# Patient Record
Sex: Male | Born: 1945 | Race: White | Hispanic: No | Marital: Married | Smoking: Never smoker
Health system: Southern US, Community
[De-identification: ages and names within clinical notes are randomized; demographics above are authoritative.]

## PROBLEM LIST (undated history)

## (undated) DIAGNOSIS — I10 Essential (primary) hypertension: Secondary | ICD-10-CM

## (undated) DIAGNOSIS — K589 Irritable bowel syndrome without diarrhea: Secondary | ICD-10-CM

## (undated) DIAGNOSIS — K219 Gastro-esophageal reflux disease without esophagitis: Secondary | ICD-10-CM

## (undated) DIAGNOSIS — K9189 Other postprocedural complications and disorders of digestive system: Secondary | ICD-10-CM

## (undated) DIAGNOSIS — M1990 Unspecified osteoarthritis, unspecified site: Secondary | ICD-10-CM

## (undated) DIAGNOSIS — Z9889 Other specified postprocedural states: Secondary | ICD-10-CM

## (undated) DIAGNOSIS — K567 Ileus, unspecified: Secondary | ICD-10-CM

## (undated) DIAGNOSIS — R112 Nausea with vomiting, unspecified: Secondary | ICD-10-CM

## (undated) DIAGNOSIS — K449 Diaphragmatic hernia without obstruction or gangrene: Secondary | ICD-10-CM

## (undated) HISTORY — DX: Essential (primary) hypertension: I10

## (undated) HISTORY — PX: BACK SURGERY: SHX140

## (undated) HISTORY — DX: Gastro-esophageal reflux disease without esophagitis: K21.9

## (undated) HISTORY — PX: SHOULDER OPEN ROTATOR CUFF REPAIR: SHX2407

## (undated) HISTORY — DX: Diaphragmatic hernia without obstruction or gangrene: K44.9

## (undated) HISTORY — DX: Irritable bowel syndrome, unspecified: K58.9

---
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
## 1978-11-06 HISTORY — PX: EYE SURGERY: SHX253

## 1988-11-05 HISTORY — PX: ACHILLES TENDON REPAIR: SUR1153

## 1998-11-06 HISTORY — PX: ELBOW SURGERY: SHX618

## 1998-12-15 ENCOUNTER — Encounter: Payer: Self-pay | Admitting: Gastroenterology

## 1998-12-15 ENCOUNTER — Ambulatory Visit (HOSPITAL_COMMUNITY): Admission: RE | Admit: 1998-12-15 | Discharge: 1998-12-15 | Payer: Self-pay | Admitting: Gastroenterology

## 1999-10-27 ENCOUNTER — Encounter: Admission: RE | Admit: 1999-10-27 | Discharge: 1999-10-27 | Payer: Self-pay

## 2000-03-07 HISTORY — PX: ROTATOR CUFF REPAIR: SHX139

## 2000-07-21 ENCOUNTER — Encounter: Payer: Self-pay | Admitting: *Deleted

## 2000-07-21 ENCOUNTER — Emergency Department (HOSPITAL_COMMUNITY): Admission: EM | Admit: 2000-07-21 | Discharge: 2000-07-21 | Payer: Self-pay | Admitting: *Deleted

## 2000-07-22 ENCOUNTER — Inpatient Hospital Stay (HOSPITAL_COMMUNITY): Admission: EM | Admit: 2000-07-22 | Discharge: 2000-07-24 | Payer: Self-pay

## 2000-08-23 ENCOUNTER — Ambulatory Visit (HOSPITAL_COMMUNITY): Admission: RE | Admit: 2000-08-23 | Discharge: 2000-08-23 | Payer: Self-pay | Admitting: Gastroenterology

## 2002-11-05 ENCOUNTER — Encounter: Admission: RE | Admit: 2002-11-05 | Discharge: 2002-11-05 | Payer: Self-pay | Admitting: Internal Medicine

## 2002-11-05 ENCOUNTER — Encounter: Payer: Self-pay | Admitting: Internal Medicine

## 2005-01-14 ENCOUNTER — Ambulatory Visit: Payer: Self-pay | Admitting: Hematology & Oncology

## 2005-07-01 ENCOUNTER — Ambulatory Visit: Payer: Self-pay | Admitting: Internal Medicine

## 2009-03-07 HISTORY — PX: SHOULDER ARTHROSCOPY W/ ROTATOR CUFF REPAIR: SHX2400

## 2010-03-28 ENCOUNTER — Encounter: Payer: Self-pay | Admitting: Orthopaedic Surgery

## 2010-10-01 ENCOUNTER — Encounter (INDEPENDENT_AMBULATORY_CARE_PROVIDER_SITE_OTHER): Payer: Self-pay | Admitting: Surgery

## 2010-10-01 ENCOUNTER — Ambulatory Visit (INDEPENDENT_AMBULATORY_CARE_PROVIDER_SITE_OTHER): Payer: PRIVATE HEALTH INSURANCE | Admitting: Surgery

## 2010-10-01 VITALS — BP 146/94 | HR 64 | Temp 97.6°F | Ht 71.0 in | Wt 175.0 lb

## 2010-10-01 DIAGNOSIS — K219 Gastro-esophageal reflux disease without esophagitis: Secondary | ICD-10-CM

## 2010-10-01 NOTE — Progress Notes (Signed)
Addended by: Latricia Heft on: 10/01/2010 02:59 PM   Modules accepted: Orders

## 2010-10-01 NOTE — Progress Notes (Signed)
Subjective:     Patient ID: Ronnie Hardy, male   DOB: June 19, 1945, 65 y.o.   MRN: [id number]  HPIMr. Ronnie Hardy comes in today to discuss gastroesophageal reflux and Nissen fundoplication. He was referred by Dr. Merri Brunette has been followed over the years by Dr. Sheryn Bison. He has been complaining of reading about Nissen fundoplication seems well versed in the procedure risks and complications. Over years he has had problems with adult-onset asthma. He describes bending over and refluxing and refluxing occasionally at night. He doesn't have pain if he takes a PPI. He is concerned about his long-term use of the PPIs and its effect on his B12 levels. He has had some foot numbness.  We discussed the procedure in detail. He wants to go and get this scheduled.  Her medications include all site over-the-counter and Lunesta 2 mg daily. His prior surgeries include Achilles tendon repair 25 years ago, left shoulder rotator cuff repair 10 years ago right shoulder rotator cuff repair in 2011 and a left and right elbow and repairs back in 2000 and 2010.  15 point review of systems is normal except for some rash easy infusion bruisability. He however states he's not a free bleeder. He has some irritable bowel disease and GERD as previously mentioned. Family history is mother and father both deceased with heart disease his mother had cancer. One living brother. He does not smoke has never smoked. He drinks occasionally.  Physical exam reveals a well developed well maintained younger than his stated age white male 5 feet 11 weight 175. Blood pressure 146/94 pulse 64 afebrile. Head normocephalic eyes sclerae nonicteric pupils equal round reactive to light nose and throat exam unremarkable neck higher no bruits. Chest is clear to auscultation heart sinus rhythm without murmurs or gallops abdomen nontender no masses or organomegaly. Extremity exam elbow surgery as mentioned above. No cyanosis edema clubbing. No  history of DVT. Neuro alert and oriented x3. Motor incision function grossly intact. Slight patient is appropriate affect and good fund of knowledge.  Impression significant history of gastroesophageal reflux disease probably with hiatal hernia. Plan to get upper GI will go and scheduled for a laparoscopic Nissen fundoplication. Aware that these can also be done open.   Review of Systems   Objective:   Physical Exam     Assessment:         Plan:    Lap Nissen

## 2010-10-12 ENCOUNTER — Ambulatory Visit (HOSPITAL_COMMUNITY): Admission: RE | Admit: 2010-10-12 | Payer: PRIVATE HEALTH INSURANCE | Source: Ambulatory Visit

## 2010-10-19 ENCOUNTER — Inpatient Hospital Stay (HOSPITAL_COMMUNITY): Admission: RE | Admit: 2010-10-19 | Payer: PRIVATE HEALTH INSURANCE | Source: Ambulatory Visit | Admitting: Surgery

## 2011-01-07 ENCOUNTER — Other Ambulatory Visit: Payer: Self-pay | Admitting: Internal Medicine

## 2011-01-07 DIAGNOSIS — M545 Low back pain, unspecified: Secondary | ICD-10-CM

## 2011-01-17 ENCOUNTER — Ambulatory Visit
Admission: RE | Admit: 2011-01-17 | Discharge: 2011-01-17 | Disposition: A | Discharge status: Home / Self Care | Payer: PRIVATE HEALTH INSURANCE | Source: Ambulatory Visit | Attending: Internal Medicine | Admitting: Internal Medicine

## 2011-01-17 DIAGNOSIS — M545 Low back pain, unspecified: Secondary | ICD-10-CM

## 2011-11-09 ENCOUNTER — Other Ambulatory Visit: Payer: Self-pay | Admitting: Neurosurgery

## 2011-11-09 DIAGNOSIS — M545 Low back pain, unspecified: Secondary | ICD-10-CM

## 2011-11-11 ENCOUNTER — Other Ambulatory Visit: Payer: PRIVATE HEALTH INSURANCE

## 2011-11-17 ENCOUNTER — Ambulatory Visit
Admission: RE | Admit: 2011-11-17 | Discharge: 2011-11-17 | Disposition: A | Discharge status: Home / Self Care | Payer: PRIVATE HEALTH INSURANCE | Source: Ambulatory Visit | Attending: Neurosurgery | Admitting: Neurosurgery

## 2011-11-17 DIAGNOSIS — M545 Low back pain, unspecified: Secondary | ICD-10-CM

## 2012-01-06 ENCOUNTER — Ambulatory Visit (INDEPENDENT_AMBULATORY_CARE_PROVIDER_SITE_OTHER): Payer: PRIVATE HEALTH INSURANCE | Admitting: Sports Medicine

## 2012-01-06 ENCOUNTER — Encounter: Payer: Self-pay | Admitting: Sports Medicine

## 2012-01-06 VITALS — BP 130/81 | HR 70 | Ht 71.0 in | Wt 170.0 lb

## 2012-01-06 DIAGNOSIS — M7661 Achilles tendinitis, right leg: Secondary | ICD-10-CM | POA: Insufficient documentation

## 2012-01-06 DIAGNOSIS — M7660 Achilles tendinitis, unspecified leg: Secondary | ICD-10-CM

## 2012-01-06 MED ORDER — NITROGLYCERIN 0.2 MG/HR TD PT24: MEDICATED_PATCH | TRANSDERMAL | Status: DC

## 2012-01-06 NOTE — Assessment & Plan Note (Signed)
Patient with an Achilles tendon nodule with evidence of tendinosis.  Hypoechoic change thickness calcification and increased Doppler activity present.  The pop the patient felt may have been  a plantaris tendon rupture. Plan: Eccentric calf exercises  Nitroglycerin protocol  Followup 6 weeks

## 2012-01-06 NOTE — Patient Instructions (Signed)
Thank you for coming in today. You have achilles tendonitis. Do the calf eccentric exercises.  15-20 reps 2 sets x2 daily  Come back in 6 weeks or sooner if needed.  Nitroglycerin Protocol   Apply 1/4 nitroglycerin patch to affected area daily.  Change position of patch within the affected area every 24 hours.  You may experience a headache during the first 1-2 weeks of using the patch, these should subside.  If you experience headaches after beginning nitroglycerin patch treatment, you may take your preferred over the counter pain reliever.  Another side effect of the nitroglycerin patch is skin irritation or rash related to patch adhesive.  Please notify our office if you develop more severe headaches or rash, and stop the patch.  Tendon healing with nitroglycerin patch may require 12 to 24 weeks depending on the extent of injury.  Men should not use if taking Viagra, Cialis, or Levitra.   Do not use if you have migraines or rosacea.

## 2012-01-06 NOTE — Progress Notes (Signed)
Ronnie Hardy is a 66 y.o. male who presents to [facility] today for right Achilles tendon pain.  Present for about a month.  Patient was stretching his Achilles tendon after exercise when he felt a pop in his proximal calf.  He was told he may have a gastrocnemius tear.  He never developed any significant ecchymosis or swelling.  He notes continued pain in the distal Achilles tendon a few centimeters proximal to the insertion of the calcaneus.   His pain is worse with Achilles tendon dominant exercise such as cycling on his toes up a hill.  He denies any functional impairment.  He feels well otherwise without any radiating pain weakness or numbness.  He has a pertinent medical history for a left Achilles tendon nodule that was debrieded 20-30 years ago.    PMH reviewed.  Significant for hypertension no history of headaches History  Substance Use Topics  . Smoking status: Never Smoker   . Smokeless tobacco: Never Used  . Alcohol Use: Yes     occasional   ROS as above otherwise neg   Exam:  BP 130/81  Pulse 70  Ht 5\' 11"  (1.803 m)  Wt 170 lb (77.111 kg)  BMI 23.71 kg/m2 Gen: Well NAD MSK:  Right lower leg.  No significant calf swelling or ecchymosis.  Mildly swollen nodule over the distal Achilles tendon. Mildly tender over the distal Achilles tendon nodule. Normal foot motion strength. Normal sensation capillary refill and pulses distally.   Musculoskeletal ultrasound of the right Achilles tendon. Normal-appearing tendon at the insertion of the calcaneus measures 0.46 cm  Significant nodule seen approximately 2-3 cm proximal maximal thickness is 1.3 cm. Small fleck of calcification and a longitudinal split with increased Doppler activity seen within the nodule.  Proximal Achilles tendon is normal appearing.  Proximal calf is normal with no significant tear of the gastrocnemius or soleus.

## 2012-01-09 ENCOUNTER — Encounter: Payer: Self-pay | Admitting: Cardiovascular Disease

## 2012-01-09 ENCOUNTER — Ambulatory Visit (INDEPENDENT_AMBULATORY_CARE_PROVIDER_SITE_OTHER): Payer: PRIVATE HEALTH INSURANCE | Admitting: Cardiovascular Disease

## 2012-01-09 VITALS — BP 125/80 | HR 73 | Ht 71.0 in | Wt 177.0 lb

## 2012-01-09 DIAGNOSIS — I10 Essential (primary) hypertension: Secondary | ICD-10-CM

## 2012-01-09 MED ORDER — LOSARTAN POTASSIUM 100 MG PO TABS: 100.0000 mg | ORAL_TABLET | Freq: Every day | ORAL | Status: DC

## 2012-01-09 MED ORDER — HYDROCHLOROTHIAZIDE 25 MG PO TABS: 25.0000 mg | ORAL_TABLET | Freq: Every day | ORAL | Status: DC

## 2012-01-09 NOTE — Patient Instructions (Addendum)
Your physician recommends that you return for lab work in: today  Your physician has recommended you make the following change in your medication:   Start losartan 100mg  daily Start HCTZ 25 mg every morning  Your physician recommends that you schedule a follow-up appointment in: 3 months

## 2012-01-09 NOTE — Progress Notes (Signed)
Ronnie Hardy Date of Birth  24-Jun-1945       [facility] Office [address]  [address] [place], [place]  [address]   [place], [place]  [address] [phone]     [phone]   Fax  [phone]    Fax [phone]  Problem List: 1. Hypertension 2. Asthma   History of Present Illness:  Ronnie Hardy is a 66 yo with hx of HTN.  Avoids salty or fatty foods.  Works out daily .    He has had HTN for the past 5 years or so.   He has a family hx of heart disease ( mother died of stroke, father died of MI - smoker)   He has throbbing in his throat and then knows that his BP is elevated.    He has not been diagnosed with sleep apnea but admits that he doesn't sleep well.    Current Outpatient Prescriptions on File Prior to Visit  Medication Sig Dispense Refill  . amLODipine (NORVASC) 10 MG tablet Take 10 mg by mouth daily.      . Fluticasone-Salmeterol (ADVAIR DISKUS IN) Inhale into the lungs as needed.      Marland Kitchen losartan (COZAAR) 25 MG tablet Take 25 mg by mouth daily.      . nitroGLYCERIN (NITRODUR - DOSED IN MG/24 HR) 0.2 mg/hr 1/4 patch to achilles tendon daily for pain  30 patch  1  . Omeprazole (PRILOSEC PO) Take by mouth daily. Taking the OTC. Might be 10 mg, patient is not sure.       . Eszopiclone (ESZOPICLONE) 3 MG TABS Take 3 mg by mouth at bedtime. Take immediately before bedtime         Allergies  Allergen Reactions  . Anesthetics, Amide Nausea Only    Surgical anesthetic    Past Medical History  Diagnosis Date  . Hypertension   . IBS (irritable bowel syndrome)   . Hiatal hernia   . GERD (gastroesophageal reflux disease)   . Asthma     Past Surgical History  Procedure Date  . Rotator cuff repair 2002    left  . Achilles tendon repair   . Right shoulder surgery 2011  . Elbow surgery     both    History  Smoking status  . Never Smoker   Smokeless tobacco  . Never Used    History  Alcohol Use  .  Yes    Comment: occasional    Family History  Problem Relation Age of Onset  . Heart disease Mother   . Cancer Mother   . Heart disease Father     Reviw of Systems:  Reviewed in the HPI.  All other systems are negative.  Physical Exam: Blood pressure 130/80, pulse 73, height 5\' 11"  (1.803 m), weight 177 lb (80.287 kg), SpO2 98.00%. General: Well developed, well nourished, in no acute distress.  Head: Normocephalic, atraumatic, sclera non-icteric, mucus membranes are moist,   Neck: Supple. Carotids are 2 + without bruits. No JVD  Lungs: slight wheezing in the left base  Heart: regular rate.  normal  S1 S2. No murmurs, gallops or rubs.  Abdomen: Soft, non-tender, non-distended with normal bowel sounds. No hepatomegaly. No rebound/guarding. No masses.  Msk:  Strength and tone are normal  Extremities: No clubbing or cyanosis. No edema.  Distal pedal pulses are 2+ and equal bilaterally.  Neuro: Alert and oriented X 3. Moves all extremities  spontaneously.  Psych:  Responds to questions appropriately with a normal affect.  ECG: NSR  Assessment / Plan:

## 2012-01-09 NOTE — Assessment & Plan Note (Signed)
Ronnie Hardy presents for discussion of his HTN. His BP is frequently normal but is high on occasion.  He exercises regularly and watches his diet.  He is on a good medical regimen and his BP is normal here today.  I suspect his HTN is being exacerbated by his lack of restfull sleep.  I have suggested that he see a councilor for sleep hygeine - has has done many of the usual things to get to sleep.  He will try Yoga and other relaxation techniques.   We discussed trying Bystolic instead of AMlodipine.  I will see him back in several months.

## 2012-01-10 LAB — BASIC METABOLIC PANEL
Calcium: 8.8 mg/dL (ref 8.4–10.5)
GFR: 87.38 mL/min (ref >60.00)
Glucose, Bld: 87 mg/dL (ref 70–99)
Sodium: 135 mEq/L (ref 135–145)

## 2012-02-15 ENCOUNTER — Ambulatory Visit: Payer: PRIVATE HEALTH INSURANCE | Admitting: Family Medicine

## 2012-04-10 ENCOUNTER — Ambulatory Visit (INDEPENDENT_AMBULATORY_CARE_PROVIDER_SITE_OTHER): Payer: PRIVATE HEALTH INSURANCE | Admitting: Cardiovascular Disease

## 2012-04-10 ENCOUNTER — Encounter: Payer: Self-pay | Admitting: Cardiovascular Disease

## 2012-04-10 VITALS — BP 130/82 | HR 67 | Ht 71.0 in | Wt 178.0 lb

## 2012-04-10 DIAGNOSIS — I10 Essential (primary) hypertension: Secondary | ICD-10-CM

## 2012-04-10 NOTE — Patient Instructions (Addendum)
Call wellness nurse-  Orbie Hurst [phone]  Your physician recommends that you return for a FASTING lipid profile: 6 MONTHS  Your physician wants you to follow-up in: 6 MONTHS WITH EKG You will receive a reminder letter in the mail two months in advance. If you don't receive a letter, please call our office to schedule the follow-up appointment.  Your physician recommends that you continue on your current medications as directed. Please refer to the Current Medication list given to you today.

## 2012-04-10 NOTE — Assessment & Plan Note (Signed)
Ronnie Hardy is doing well from a cardiac standpoint. His blood pressure readings have been at the upper limits of normal. He still is not sleeping well. I've given him the name of a nurse practitioner  Wellsite geologist) who has a Chartered loss adjuster.  She may be of help him with relaxation techniques.  We'll continue with his same medications. I seen again in 6 months. We'll check fasting lipid profile, basic profile, and EKG at that time.

## 2012-04-10 NOTE — Progress Notes (Signed)
Ronnie Hardy Date of Birth  08-21-1945       [facility] Office [address]  [address] [place], [place]  [address]   [place], [place]  [address] [phone]     [phone]   Fax  [phone]    Fax [phone]  Problem List: 1. Hypertension 2. Asthma   History of Present Illness:  Ronnie Hardy is a 67 yo with hx of HTN.  Avoids salty or fatty foods.  Works out daily .    He has had HTN for the past 5 years or so.   He has a family hx of heart disease ( mother died of stroke, father died of MI - smoker)   He has throbbing in his throat and then knows that his BP is elevated.   He has not been diagnosed with sleep apnea but admits that he doesn't sleep well.    Feb. 4, 2014: BP has been 130s / 80s.  He was started on Horizant but it seems to be causing some peripheral edema.   Current Outpatient Prescriptions on File Prior to Visit  Medication Sig Dispense Refill  . cyclobenzaprine (FLEXERIL) 10 MG tablet Take 10 mg by mouth daily.      . Eszopiclone (ESZOPICLONE) 3 MG TABS Take 3 mg by mouth at bedtime. Take immediately before bedtime       . Fluticasone-Salmeterol (ADVAIR DISKUS IN) Inhale into the lungs as needed.      . hydrochlorothiazide (HYDRODIURIL) 25 MG tablet Take 1 tablet (25 mg total) by mouth daily.  90 tablet  1  . losartan (COZAAR) 100 MG tablet Take 1 tablet (100 mg total) by mouth daily.  90 tablet  1  . nitroGLYCERIN (NITRODUR - DOSED IN MG/24 HR) 0.2 mg/hr 1/4 patch to achilles tendon daily for pain  30 patch  1  . Omeprazole (PRILOSEC PO) Take by mouth daily. Taking the OTC. Might be 10 mg, patient is not sure.         Allergies  Allergen Reactions  . Anesthetics, Amide Nausea Only    Surgical anesthetic    Past Medical History  Diagnosis Date  . Hypertension   . IBS (irritable bowel syndrome)   . Hiatal hernia   . GERD (gastroesophageal reflux disease)   . Asthma     Past Surgical  History  Procedure Date  . Rotator cuff repair 2002    left  . Achilles tendon repair   . Right shoulder surgery 2011  . Elbow surgery     both    History  Smoking status  . Never Smoker   Smokeless tobacco  . Never Used    History  Alcohol Use  . Yes    Comment: occasional    Family History  Problem Relation Age of Onset  . Heart disease Mother   . Cancer Mother   . Heart disease Father     Reviw of Systems:  Reviewed in the HPI.  All other systems are negative.  Physical Exam: Blood pressure 130/82, pulse 67, height 5\' 11"  (1.803 m), weight 178 lb (80.74 kg). General: Well developed, well nourished, in no acute distress.  Head: Normocephalic, atraumatic, sclera non-icteric, mucus membranes are moist,   Neck: Supple. Carotids are 2 + without bruits. No JVD  Lungs: slight wheezing in the left base  Heart: regular rate.  normal  S1 S2. No murmurs, gallops or rubs.  Abdomen:  Soft, non-tender, non-distended with normal bowel sounds. No hepatomegaly. No rebound/guarding. No masses.  Msk:  Strength and tone are normal  Extremities: No clubbing or cyanosis. No edema.  Distal pedal pulses are 2+ and equal bilaterally.  Neuro: Alert and oriented X 3. Moves all extremities spontaneously.  Psych:  Responds to questions appropriately with a normal affect.  ECG: Every 4, 2014-normal sinus rhythm at 67. He has no ST or T wave changes.  Assessment / Plan:

## 2012-06-18 ENCOUNTER — Ambulatory Visit (INDEPENDENT_AMBULATORY_CARE_PROVIDER_SITE_OTHER): Payer: PRIVATE HEALTH INSURANCE | Admitting: Sports Medicine

## 2012-06-18 ENCOUNTER — Encounter: Payer: Self-pay | Admitting: Sports Medicine

## 2012-06-18 VITALS — BP 124/74 | Ht 71.0 in | Wt 170.0 lb

## 2012-06-18 DIAGNOSIS — M48061 Spinal stenosis, lumbar region without neurogenic claudication: Secondary | ICD-10-CM | POA: Insufficient documentation

## 2012-06-18 DIAGNOSIS — M7660 Achilles tendinitis, unspecified leg: Secondary | ICD-10-CM

## 2012-06-18 DIAGNOSIS — M7661 Achilles tendinitis, right leg: Secondary | ICD-10-CM

## 2012-06-18 DIAGNOSIS — G4700 Insomnia, unspecified: Secondary | ICD-10-CM

## 2012-06-18 MED ORDER — NITROGLYCERIN 0.2 MG/HR TD PT24: MEDICATED_PATCH | TRANSDERMAL | Status: DC

## 2012-06-18 NOTE — Assessment & Plan Note (Signed)
I think his RLS sxs may relate more to the spinal stenosis inspite of findings on sleep studies He seems to have specific triggers that make this worse -- including sitting and driving More sxs if standing too long or fatigue  I agree with Dr Carolee Rota approach to use gabapentin However, patient has never gotten above about 300 mgms 2/2 side effects  Try to wean Lunesta and build dose of gabapentin Stop flexeril  If this strategy not working consider other medical options

## 2012-06-18 NOTE — Assessment & Plan Note (Signed)
He is clearly improved I think he could benefit from continuing eccentric rehab and NTG protocol as he still has a lot of remodeling to do on both RT which was the recent injury and LT which has had surgery  Rescan this in 2 to 3 mos

## 2012-06-18 NOTE — Patient Instructions (Addendum)
Horizant is a type of gabapentin  Dose is now too low to have maximal effect  Try stop flexeril  Try weaning Lunesta - by cutting in half and after 1 week leave out tab every 3 day then every other day for 1 week then stop  Immediately up the Horizant to a full tab - take earlier in evening if makes you sleepy  Let me know at 3 weeks are you seeing benefit  If not we want to see if we can boost you to therapeutic dose  If not tolerated we need to move to different product  Keep easy back motion/ yoga is good/  Work easy flexion exercises - knee to chest  Keep up aerobic exercise as tolerated but work in some walking

## 2012-06-18 NOTE — Progress Notes (Signed)
Patient ID: Ronnie Hardy, male   DOB: [birth year], 67 y.o.   MRN: [id number]  Hx of possible RLS However has documented lumbar spinal stenosis He is on medications as listed but also uses a brand Gabapentin medication 300 mgm qhs (1/2 tab) He has chronic insomnia Dr Renne Crigler has sent him for 3? Sleep studies which showed a lot of RLS activity but the patient states that he never came close to getting into any good sleep pattern during these studies  He has known lumbar spinal stenosis Dr Wyline Mood NS at [facility] has operated between MRIs in 2012 and 2013 This helped some of the radiating sxs However, Dr Wyline Mood does not feel like a fusion would not really resolve this and that at this point medical management is best option as he has several levels of DDD as well as foraminal and facet joint changes that cause nerve compression  He never built the dose of gabapentin since he gets so sedated using this in combo with lunesta and flexeril Lunesta now wears off within 4 hours and flexeril helps him sleep through night  No longer able to run Bikes regularly and that does not worsen sxs Sitting worsens sxs if longer than 2 hrs/ 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  Now no TTP Lt AT is somewhat thick and irregular but non-tender  LB exam Flexion normal without pain Extension limited to 15 to 20 deg Lat bend very limited  Rotation tight but iproves with stretching  Heel/ Toe walk normal Heel raise shows good strength and post tib function  MRI  Reviewed with patient and findings of lumbar stenosis, facet DJD and DDD demonstrated and discussed Left AT shows surgical

## 2012-06-18 NOTE — Assessment & Plan Note (Signed)
Seems that he is getting limited benefit from his lunesta and flexeril at this point  Hoping that he will get more benefit from higher dose gabapentin  Will follow up after 4 to 6 weeks of medication changes and see if we can find better options

## 2012-06-19 ENCOUNTER — Ambulatory Visit: Payer: PRIVATE HEALTH INSURANCE | Admitting: Sports Medicine

## 2012-07-10 ENCOUNTER — Other Ambulatory Visit: Payer: Self-pay | Admitting: *Deleted

## 2012-07-10 DIAGNOSIS — I10 Essential (primary) hypertension: Secondary | ICD-10-CM

## 2012-07-10 MED ORDER — HYDROCHLOROTHIAZIDE 25 MG PO TABS: 25.0000 mg | ORAL_TABLET | Freq: Every day | ORAL | Status: DC

## 2012-07-10 MED ORDER — LOSARTAN POTASSIUM 100 MG PO TABS: 100.0000 mg | ORAL_TABLET | Freq: Every day | ORAL | Status: DC

## 2012-07-10 NOTE — Telephone Encounter (Signed)
Fax Received. Refill Completed. Yanissa Michalsky Chowoe (R.M.A)   

## 2012-07-10 NOTE — Telephone Encounter (Signed)
Fax Received. Refill Completed. Jeily Guthridge Chowoe (R.M.A)   

## 2013-09-20 ENCOUNTER — Other Ambulatory Visit: Payer: Self-pay | Admitting: Physician Assistant

## 2013-09-20 ENCOUNTER — Other Ambulatory Visit: Payer: Self-pay

## 2013-09-20 DIAGNOSIS — M5136 Other intervertebral disc degeneration, lumbar region: Secondary | ICD-10-CM

## 2013-09-28 ENCOUNTER — Other Ambulatory Visit: Payer: PRIVATE HEALTH INSURANCE

## 2014-04-20 HISTORY — PX: POSTERIOR FUSION LUMBAR SPINE, MINIMALLY INVASIVE: SUR633

## 2014-05-22 ENCOUNTER — Encounter (HOSPITAL_COMMUNITY): Payer: Self-pay | Admitting: General Practice

## 2014-05-22 ENCOUNTER — Observation Stay (HOSPITAL_COMMUNITY)
Admission: AD | Admit: 2014-05-22 | Discharge: 2014-05-23 | Discharge status: Against Medical Advice | Payer: PRIVATE HEALTH INSURANCE | Source: Ambulatory Visit | Attending: Gastroenterology | Admitting: Gastroenterology

## 2014-05-22 ENCOUNTER — Ambulatory Visit
Admission: RE | Admit: 2014-05-22 | Discharge: 2014-05-22 | Disposition: A | Discharge status: Home / Self Care | Payer: PRIVATE HEALTH INSURANCE | Source: Ambulatory Visit | Attending: Gastroenterology | Admitting: Gastroenterology

## 2014-05-22 ENCOUNTER — Other Ambulatory Visit: Payer: Self-pay | Admitting: Gastroenterology

## 2014-05-22 DIAGNOSIS — I10 Essential (primary) hypertension: Secondary | ICD-10-CM | POA: Insufficient documentation

## 2014-05-22 DIAGNOSIS — K589 Irritable bowel syndrome without diarrhea: Secondary | ICD-10-CM | POA: Diagnosis not present

## 2014-05-22 DIAGNOSIS — K913 Postprocedural intestinal obstruction: Principal | ICD-10-CM | POA: Insufficient documentation

## 2014-05-22 DIAGNOSIS — K219 Gastro-esophageal reflux disease without esophagitis: Secondary | ICD-10-CM | POA: Diagnosis not present

## 2014-05-22 DIAGNOSIS — Y929 Unspecified place or not applicable: Secondary | ICD-10-CM | POA: Insufficient documentation

## 2014-05-22 DIAGNOSIS — R1084 Generalized abdominal pain: Secondary | ICD-10-CM

## 2014-05-22 DIAGNOSIS — J45909 Unspecified asthma, uncomplicated: Secondary | ICD-10-CM | POA: Insufficient documentation

## 2014-05-22 DIAGNOSIS — F419 Anxiety disorder, unspecified: Secondary | ICD-10-CM | POA: Diagnosis not present

## 2014-05-22 DIAGNOSIS — K9189 Other postprocedural complications and disorders of digestive system: Secondary | ICD-10-CM

## 2014-05-22 DIAGNOSIS — E871 Hypo-osmolality and hyponatremia: Secondary | ICD-10-CM | POA: Diagnosis not present

## 2014-05-22 DIAGNOSIS — M549 Dorsalgia, unspecified: Secondary | ICD-10-CM | POA: Insufficient documentation

## 2014-05-22 DIAGNOSIS — Y838 Other surgical procedures as the cause of abnormal reaction of the patient, or of later complication, without mention of misadventure at the time of the procedure: Secondary | ICD-10-CM | POA: Diagnosis not present

## 2014-05-22 DIAGNOSIS — K567 Ileus, unspecified: Secondary | ICD-10-CM

## 2014-05-22 HISTORY — DX: Unspecified osteoarthritis, unspecified site: M19.90

## 2014-05-22 HISTORY — DX: Other postprocedural complications and disorders of digestive system: K91.89

## 2014-05-22 HISTORY — DX: Other specified postprocedural states: R11.2

## 2014-05-22 HISTORY — DX: Ileus, unspecified: K56.7

## 2014-05-22 HISTORY — DX: Nausea with vomiting, unspecified: Z98.890

## 2014-05-22 LAB — COMPREHENSIVE METABOLIC PANEL
ALT: 21 U/L (ref 0–53)
AST: 49 U/L — ABNORMAL HIGH (ref 0–37)
Albumin: 3.3 g/dL — ABNORMAL LOW (ref 3.5–5.2)
Alkaline Phosphatase: 61 U/L (ref 39–117)
Anion gap: 7 (ref 5–15)
BILIRUBIN TOTAL: 1.1 mg/dL (ref 0.3–1.2)
BUN: 11 mg/dL (ref 6–23)
CO2: 30 mmol/L (ref 19–32)
CREATININE: 0.72 mg/dL (ref 0.50–1.35)
Calcium: 8.7 mg/dL (ref 8.4–10.5)
Chloride: 91 mmol/L — ABNORMAL LOW (ref 96–112)
Glucose, Bld: 116 mg/dL — ABNORMAL HIGH (ref 70–99)
POTASSIUM: 3.9 mmol/L (ref 3.5–5.1)
Sodium: 128 mmol/L — ABNORMAL LOW (ref 135–145)
TOTAL PROTEIN: 6.3 g/dL (ref 6.0–8.3)

## 2014-05-22 LAB — CBC
HCT: 34.2 % — ABNORMAL LOW (ref 39.0–52.0)
HEMOGLOBIN: 12.1 g/dL — AB (ref 13.0–17.0)
MCH: 31.3 pg (ref 26.0–34.0)
MCHC: 35.4 g/dL (ref 30.0–36.0)
MCV: 88.4 fL (ref 78.0–100.0)
Platelets: 153 10*3/uL (ref 150–400)
RBC: 3.87 MIL/uL — AB (ref 4.22–5.81)
RDW: 12.2 % (ref 11.5–15.5)
WBC: 6.9 10*3/uL (ref 4.0–10.5)

## 2014-05-22 MED ORDER — OXYCODONE HCL 5 MG PO TABS
5.0000 mg | ORAL_TABLET | Freq: Four times a day (QID) | ORAL | Status: DC | PRN
Start: 2014-05-22 — End: 2014-05-23

## 2014-05-22 MED ORDER — FLEET ENEMA 7-19 GM/118ML RE ENEM
2.0000 | ENEMA | Freq: Once | RECTAL | Status: AC
  Administered 2014-05-22: 2 via RECTAL
  Filled 2014-05-22: qty 2

## 2014-05-22 MED ORDER — FAMOTIDINE 40 MG PO TABS
40.0000 mg | ORAL_TABLET | Freq: Two times a day (BID) | ORAL | Status: DC
  Administered 2014-05-23: 40 mg via ORAL
  Filled 2014-05-22 (×3): qty 1

## 2014-05-22 MED ORDER — SODIUM CHLORIDE 0.9 % IV BOLUS (SEPSIS): 1000.0000 mL | Freq: Once | INTRAVENOUS | Status: DC

## 2014-05-22 MED ORDER — SODIUM CHLORIDE 0.9 % IV SOLN: INTRAVENOUS | Status: DC

## 2014-05-22 NOTE — H&P (Addendum)
Ronnie Hardy HPI: This is a 69 year old male with a PMH of GERD, HTN, asthma, and lower back surgeries admitted for a post-operative ileus.  He underwent lower back surgery this past Monday and when he woke up from the surgery his abdomen was bloated.  He was treated with two enemas, one small and one large, without any benefit.  [facility] he had persistent bloating and he presented to Dr. Loreta Hardy today for further evaluation.  A KUB was obtained it was consistent with an ileus.  Over the interval time period he has not had any significant passage of flatus.  He was prescribed oxycodone 10 mg q4 hours, but he weaned himself down to 5 mg per day.  In the hospital he was on 10 mg.  His pain has been intermittent.  Currently he feels well and he is uncertain if he needs to be hospitalized.  Past Medical History  Diagnosis Date  . Hypertension   . IBS (irritable bowel syndrome)   . Hiatal hernia   . GERD (gastroesophageal reflux disease)   . Asthma     Past Surgical History  Procedure Laterality Date  . Rotator cuff repair  2002    left  . Achilles tendon repair    . Right shoulder surgery  2011  . Elbow surgery      both    Family History  Problem Relation Age of Onset  . Heart disease Mother   . Cancer Mother   . Heart disease Father     Social History:  reports that he has never smoked. He has never used smokeless tobacco. He reports that he drinks alcohol. He reports that he does not use illicit drugs.  Allergies:  Allergies  Allergen Reactions  . No Known Allergies   . Anesthetics, Amide Nausea Only    Surgical anesthetic    Medications:  Scheduled:  Continuous: . sodium chloride      No results found for this or any previous visit (from the past 24 hour(s)).   Dg Abd 2 Views  05/22/2014   CLINICAL DATA:  Abdominal pain. Status post lumbar surgery 1 week prior  EXAM: ABDOMEN - 2 VIEW  COMPARISON:  None.  FINDINGS: Supine and upright  images were obtained. There is extensive stool throughout the right colon. There is generalized bowel dilatation without appreciable air-fluid level. No free air. Air is present in the rectum. There is postoperative change in the lumbar region.  IMPRESSION: The bowel gas pattern is felt to be most consistent with a degree of ileus. There is extensive stool throughout the right colon. No free air. Postoperative change lower lumbar spine.   Electronically Signed   By: Bretta BangWilliam  Woodruff Hardy Ronnie Hardy.   On: 05/22/2014 15:12    ROS:  As stated above in the HPI otherwise negative.  Blood pressure 148/88, pulse 73, temperature 98 F (36.7 C), temperature source Oral, resp. rate 16, SpO2 98 %.    PE: Gen: NAD, Alert and Oriented HEENT:  Ronnie Hardy/AT, EOMI Neck: Supple, no LAD Lungs: CTA Bilaterally CV: RRR without M/G/R ABM: Soft, mildly distended, tympanic, +BS Ext: No C/C/E  Assessment/Plan: 1) Post-operative ileus. 2) GERD. 3) HTN.   He has some soft bowel sounds.  NSAIDs, per his report from the Surgeon, are contraindicated for pain control.  He will try to minimized his oxycodone intake.  The KUB demonstrates a mild ileus and significant stool in the right side of the  colon.  Since he is mobile I encouraged him to ambulate.  In the past lying in a prone position helped to alleviate his bloating, but this time it has not helped.  I did encouraged him to continue with this positioning.  An NG tube was offered, but he reports that he cannot tolerate the tube.  In the past a manometry was pursued, but he was not able to go through the procedure.  Plan: 1) NPO except ice chips. 2) IV hydration. 3) Check basic blood work. 4) Encouraged ambulation and prone position. 5) H2RA for GERD. 6) He already took his antihypertensives today and possibly he will be discharged tomorrow.  Ronnie Hardy 05/22/2014, 6:11 PM

## 2014-05-23 DIAGNOSIS — K913 Postprocedural intestinal obstruction: Secondary | ICD-10-CM | POA: Diagnosis not present

## 2014-05-23 LAB — BASIC METABOLIC PANEL
Anion gap: 9 (ref 5–15)
BUN: 8 mg/dL (ref 6–23)
CHLORIDE: 95 mmol/L — AB (ref 96–112)
CO2: 29 mmol/L (ref 19–32)
Calcium: 9.4 mg/dL (ref 8.4–10.5)
Creatinine, Ser: 0.7 mg/dL (ref 0.50–1.35)
GFR calc Af Amer: >90 mL/min (ref >90)
GFR calc non Af Amer: >90 mL/min (ref >90)
GLUCOSE: 99 mg/dL (ref 70–99)
POTASSIUM: 3.4 mmol/L — AB (ref 3.5–5.1)
SODIUM: 133 mmol/L — AB (ref 135–145)

## 2014-05-23 MED ORDER — POLYETHYLENE GLYCOL 3350 17 G PO PACK
17.0000 g | PACK | Freq: Once | ORAL | Status: AC
  Administered 2014-05-23: 17 g via ORAL
  Filled 2014-05-23: qty 1

## 2014-05-23 MED ORDER — LORAZEPAM 2 MG/ML IJ SOLN
2.0000 mg | Freq: Once | INTRAMUSCULAR | Status: AC
  Administered 2014-05-23: 2 mg via INTRAVENOUS
  Filled 2014-05-23: qty 1

## 2014-05-23 NOTE — Progress Notes (Signed)
UR completed 

## 2014-05-23 NOTE — Discharge Summary (Signed)
Physician Discharge Summary  Patient ID: Ronnie Hardy MRN: [id number] DOB/AGE: [birth year] 69 y.o.  Admit date: 05/22/2014 Discharge date: 05/23/2014  Admission Diagnoses: Post-operative ileus.                                         Back pain from back surgery.  Discharge Diagnoses: Post-operative ileus.                                         Back pain from back surgery. Active Problems:   Ileus, postoperative   Discharged Condition: stable  Hospital Course: Ronnie Hardy patient was admitted last evening and he had a very difficult night.  He reports that the bed was only aggravating to his back.  He called Dr. Loreta AveMann over the night and relayed to her his complaints.  Dr. Loreta AveMann assisted by giving him some enemas, holding his IV fluids, and providing him with a dose of Movantik.  Unfortunately he did not respond to these interventions.  The patient has a history of anxiety and he was administered Ativan to see if this can help this AM.  His blood work revealed that he has a hyponatremia of 128, but I do not know the values from [facility].  It is uncertain if this is from a hypovolemic hyponatremia versus hypervolemic hypernatremia.  Additionally, he is taking HCTZ, which compounds the issue.  I had a long discussion with the patient about leaving the hospital and he understands that leaving is against medical advice.  His mental status is intact, but he is agitated this AM secondary to his abdominal discomfort and his back pain.  The patient has not taken any pain medications and this has allowed him to have flatus, but no bowel movements yet.  He feels that he can obtain better care at home with his more comfortable bed and heating pad.  ADDENDUM: His AM sodium is 133.  Consults: None  Significant Diagnostic Studies: labs: BMP and CBC  Treatments: Movantik, enemas, Ativan  Discharge Exam: Blood pressure 166/84, pulse 65, temperature 98.2 F (36.8 C), temperature source Oral, resp. rate 18,  height 5\' 11"  (1.803 m), weight 78.472 kg (173 lb), SpO2 100 %. General appearance: Alert, oriented, uncomfortable GI: tympanic, soft, nontender  Disposition: Home with follow up in the office on Monday for a BMP.     Medication List    ASK your doctor about these medications        acetaminophen 325 MG tablet  Commonly known as:  TYLENOL  Take 975 mg by mouth every 8 (eight) hours.     ADVAIR DISKUS IN  Inhale into the lungs as needed.     fluticasone-salmeterol 230-21 MCG/ACT inhaler  Commonly known as:  ADVAIR HFA  Inhale into the lungs.     cyclobenzaprine 10 MG tablet  Commonly known as:  FLEXERIL  Take 10 mg by mouth daily.     eszopiclone 3 MG Tabs  Generic drug:  Eszopiclone  Take 3 mg by mouth at bedtime. Take immediately before bedtime     famotidine 10 MG tablet  Commonly known as:  PEPCID  Take 10 mg by mouth at bedtime as needed. For heartburn     folic acid 1 MG tablet  Commonly known as:  FOLVITE  Take 1  mg by mouth daily.     glucosamine-chondroitin 500-400 MG tablet  Take by mouth 3 (three) times daily.     HORIZANT 600 MG Tbcr  Generic drug:  Gabapentin Enacarbil  Take by mouth.     hydrochlorothiazide 12.5 MG capsule  Commonly known as:  MICROZIDE  Take 12.5 mg by mouth daily.     hydrochlorothiazide 25 MG tablet  Commonly known as:  HYDRODIURIL  Take 1 tablet (25 mg total) by mouth daily.     losartan 100 MG tablet  Commonly known as:  COZAAR  Take 1 tablet (100 mg total) by mouth daily.     meloxicam 7.5 MG tablet  Commonly known as:  MOBIC  Take 7.5 mg by mouth 3 (three) times daily with meals.     MULTI-VITAMINS Tabs  Take 1 tablet by mouth daily.     nitroGLYCERIN 0.2 mg/hr patch  Commonly known as:  NITRODUR - Dosed in mg/24 hr  1/4 patch to achilles tendon daily for pain     Omega-3 1000 MG Caps  Take by mouth daily.     oxyCODONE 5 MG immediate release tablet  Commonly known as:  Oxy IR/ROXICODONE  Take 5 mg by mouth  every 4 (four) hours as needed.     PRILOSEC PO  Take by mouth daily. Taking the OTC. Might be 10 mg, patient is not sure.     ranitidine 150 MG tablet  Commonly known as:  ZANTAC  Take 150 mg by mouth 2 (two) times daily.     senna-docusate 8.6-50 MG per tablet  Commonly known as:  Senokot-S  Take by mouth 2 (two) times daily.     tetrahydrozoline 0.05 % ophthalmic solution  Apply 1 drop to eye as needed.     tiZANidine 2 MG tablet  Commonly known as:  ZANAFLEX  Take 2 mg by mouth 3 (three) times daily as needed. Muscle spasms     Vitamin-B Complex Tabs  Take by mouth.         SignedJeani Hawking D 05/23/2014, 8:33 AM

## 2014-05-23 NOTE — Progress Notes (Signed)
Patient discharge teaching given, including activity, diet, follow-up appoints, and medications. Patient verbalized understanding of all discharge instructions. IV access was d/c'd. Vitals are stable. Skin is intact except as charted in most recent assessments. Pt to be escorted out by NT, to be driven home by family. 

## 2015-01-07 ENCOUNTER — Other Ambulatory Visit: Payer: Self-pay | Admitting: Otolaryngology

## 2015-01-09 ENCOUNTER — Other Ambulatory Visit: Payer: Self-pay | Admitting: Otolaryngology

## 2015-01-09 DIAGNOSIS — H905 Unspecified sensorineural hearing loss: Secondary | ICD-10-CM

## 2015-01-23 ENCOUNTER — Ambulatory Visit
Admission: RE | Admit: 2015-01-23 | Discharge: 2015-01-23 | Disposition: A | Discharge status: Home / Self Care | Payer: PRIVATE HEALTH INSURANCE | Source: Ambulatory Visit | Attending: Otolaryngology | Admitting: Otolaryngology

## 2015-01-23 DIAGNOSIS — H905 Unspecified sensorineural hearing loss: Secondary | ICD-10-CM

## 2015-01-23 MED ORDER — GADOBENATE DIMEGLUMINE 529 MG/ML IV SOLN
15.0000 mL | Freq: Once | INTRAVENOUS | Status: AC | PRN
  Administered 2015-01-23: 15 mL via INTRAVENOUS

## 2015-03-06 ENCOUNTER — Other Ambulatory Visit: Payer: Self-pay | Admitting: Neurosurgery

## 2015-03-06 DIAGNOSIS — Z981 Arthrodesis status: Secondary | ICD-10-CM

## 2015-03-06 DIAGNOSIS — M48061 Spinal stenosis, lumbar region without neurogenic claudication: Secondary | ICD-10-CM

## 2015-03-06 DIAGNOSIS — M5416 Radiculopathy, lumbar region: Secondary | ICD-10-CM

## 2015-03-07 ENCOUNTER — Other Ambulatory Visit: Payer: PRIVATE HEALTH INSURANCE

## 2015-03-15 ENCOUNTER — Ambulatory Visit
Admission: RE | Admit: 2015-03-15 | Discharge: 2015-03-15 | Disposition: A | Discharge status: Home / Self Care | Payer: PRIVATE HEALTH INSURANCE | Source: Ambulatory Visit | Attending: Neurosurgery | Admitting: Neurosurgery

## 2015-03-15 DIAGNOSIS — M5416 Radiculopathy, lumbar region: Secondary | ICD-10-CM

## 2015-03-15 DIAGNOSIS — Z981 Arthrodesis status: Secondary | ICD-10-CM

## 2015-03-15 DIAGNOSIS — M48061 Spinal stenosis, lumbar region without neurogenic claudication: Secondary | ICD-10-CM

## 2015-07-23 ENCOUNTER — Other Ambulatory Visit: Payer: Self-pay

## 2016-06-28 ENCOUNTER — Other Ambulatory Visit: Payer: Self-pay | Admitting: Internal Medicine

## 2016-06-28 DIAGNOSIS — I700 Atherosclerosis of aorta: Secondary | ICD-10-CM

## 2016-07-01 ENCOUNTER — Ambulatory Visit
Admission: RE | Admit: 2016-07-01 | Discharge: 2016-07-01 | Disposition: A | Discharge status: Home / Self Care | Payer: No Typology Code available for payment source | Source: Ambulatory Visit | Attending: Internal Medicine | Admitting: Internal Medicine

## 2016-07-01 DIAGNOSIS — I700 Atherosclerosis of aorta: Secondary | ICD-10-CM

## 2016-07-12 ENCOUNTER — Ambulatory Visit (INDEPENDENT_AMBULATORY_CARE_PROVIDER_SITE_OTHER): Payer: PRIVATE HEALTH INSURANCE | Admitting: Cardiovascular Disease

## 2016-07-12 ENCOUNTER — Telehealth (HOSPITAL_COMMUNITY): Payer: Self-pay | Admitting: *Deleted

## 2016-07-12 ENCOUNTER — Encounter: Payer: Self-pay | Admitting: Cardiovascular Disease

## 2016-07-12 VITALS — BP 110/74 | HR 58 | Ht 71.0 in | Wt 170.8 lb

## 2016-07-12 DIAGNOSIS — R0789 Other chest pain: Secondary | ICD-10-CM

## 2016-07-12 DIAGNOSIS — I2584 Coronary atherosclerosis due to calcified coronary lesion: Secondary | ICD-10-CM | POA: Diagnosis not present

## 2016-07-12 DIAGNOSIS — I2510 Atherosclerotic heart disease of native coronary artery without angina pectoris: Secondary | ICD-10-CM

## 2016-07-12 NOTE — Progress Notes (Signed)
Ronnie Hardy Date of Birth  [birth year]       [facility] Office [address]  [address] [place], [place]  [address]   [place], [place]  [address] [phone]     [phone]   Fax  [phone]    Fax [phone]  Problem List: 1. Hypertension 2. Asthma 3. Coronary artery calcifications   01/07/2013 Ronnie Hardy is a 71 yo with hx of HTN.  Avoids salty or fatty foods.  Works out daily .    He has had HTN for the past 5 years or so.   He has a family hx of heart disease ( mother died of stroke, father died of MI - smoker)   He has throbbing in his throat and then knows that his BP is elevated.   He has not been diagnosed with sleep apnea but admits that he doesn't sleep well.    Feb. 4, 2014: BP has been 130s / 80s.  He was started on Horizant but it seems to be causing some peripheral edema.   Ronnie Hardy is seen back for evaluation of coronary artery calcifications . In March he developed a bid chest cold.   Was still short of breath.   CXR showed some calcifications on the aorta ( and ? Coronary )    A coronary calcium score was 390 which is in the 68% of matched subjects   Was started on a statin and ASA by Dr. Renne Crigler.   Works out 5 times a week. No angina with work outs .   Has some  He goes mountain biking on occasion. Has some dyspnea recently which seems to resolve as he continues to cycle.  The tightness seems to be worse in the afternoon and evening.    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  Had a heart cath by Dr. Lorenda Hatchet 2003.  Cath was normal.     Current Outpatient Prescriptions on File Prior to Visit  Medication Sig Dispense Refill  . B Complex Vitamins (VITAMIN-B COMPLEX) TABS Take by mouth.    . Eszopiclone (ESZOPICLONE) 3 MG TABS Take 3 mg by mouth at bedtime. Take immediately before bedtime     . famotidine (PEPCID) 10 MG tablet Take 10 mg by mouth at bedtime as needed. For heartburn     . Fluticasone-Salmeterol (ADVAIR DISKUS IN) Inhale into the lungs as needed.    Marland Kitchen losartan (COZAAR) 100 MG tablet Take 1 tablet (100 mg total) by mouth daily. 90 tablet 3  . Multiple Vitamin (MULTI-VITAMINS) TABS Take 1 tablet by mouth daily.    . Omega-3 1000 MG CAPS Take by mouth daily.    . ranitidine (ZANTAC) 150 MG tablet Take 150 mg by mouth 2 (two) times daily.    Marland Kitchen tetrahydrozoline 0.05 % ophthalmic solution Apply 1 drop to eye as needed (allergies).      No current facility-administered medications on file prior to visit.     Allergies  Allergen Reactions  . Meperidine Nausea And Vomiting  . No Known Allergies   . Anesthetics, Amide Nausea Only    Surgical anesthetic    Past Medical History:  Diagnosis Date  . Arthritis    "all over"  . Asthma   . GERD (gastroesophageal reflux disease)   . Hiatal hernia   . Hypertension   . IBS (irritable bowel syndrome)   . PONV (postoperative nausea and vomiting)   .  Postoperative ileus (HCC) 05/22/2014   S/P back OR 05/19/2014    Past Surgical History:  Procedure Laterality Date  . ACHILLES TENDON REPAIR Left 1990's  . BACK SURGERY    . ELBOW SURGERY Bilateral 2000's   "scoped them and took out bone chips"  . EYE SURGERY Left 1980's   "removed splinters"  . POSTERIOR FUSION LUMBAR SPINE, MINIMALLY INVASIVE  04/20/2014  . ROTATOR CUFF REPAIR Left 2002  . SHOULDER ARTHROSCOPY W/ ROTATOR CUFF REPAIR Right 2011  . SHOULDER OPEN ROTATOR CUFF REPAIR Left 1990's; 2000's  . TONSILLECTOMY  1954    History  Smoking Status  . Never Smoker  Smokeless Tobacco  . Never Used    History  Alcohol Use  . Yes    Comment: 05/22/2014 "a drink q 2 wk or so"    Family History  Problem Relation Age of Onset  . Heart disease Mother   . Cancer Mother   . Heart disease Father     Reviw of Systems:  Reviewed in the HPI.  All other systems are negative.  Physical Exam: Blood pressure 110/74, pulse (!) 58, height 5\' 11"  (1.803 m),  weight 170 lb 12.8 oz (77.5 kg). General: Well developed, well nourished, in no acute distress.  Head: Normocephalic, atraumatic, sclera non-icteric, mucus membranes are moist,   Neck: Supple. Carotids are 2 + without bruits. No JVD  Lungs: slight wheezing in the left base  Heart: regular rate.  normal  S1 S2. No murmurs, gallops or rubs.  Abdomen: Soft, non-tender, non-distended with normal bowel sounds. No hepatomegaly. No rebound/guarding. No masses.  Msk:  Strength and tone are normal  Extremities: No clubbing or cyanosis. No edema.  Distal pedal pulses are 2+ and equal bilaterally.  Neuro: Alert and oriented X 3. Moves all extremities spontaneously.  Psych:  Responds to questions appropriately with a normal affect.  ECG: Jul 12, 2016:   Sinus brady with 1st degree AV block   Assessment / Plan:   1.  coronary calcifications: Ronnie KotykJay presents today with recent diagnosis of coronary calcifications primarily in the left anterior descending artery. His  Score is 390. He is having some episodes of chest tightness and shortness of breath with exertion. We'll schedule him for a stress Myoview study the next several days. I would have a low threshold to proceed with cardiac catheterization if he has any progression or acceleration in his symptoms.  He has been started on aspirin 81 mg a day and rosuvastatin 5 mg a day.  We'll ask Dr. Renne CriglerPharr to send in his most recent lipid levels. I anticipate increasing his rosuvastatin to 10 or perhaps 20 mg a day.  2. Hypertension: His blood pressure is normal. I'll see him in follow-up in 6 weeks-sooner if needed.    Ronnie MissPhilip Nahser, MD  07/12/2016 11:00 AM    [facility] [address],  [address] [place], [place]  [address] Pager [phone] Phone: [phone]; Fax: [phone]) [phone]

## 2016-07-12 NOTE — Telephone Encounter (Signed)
Left message on voicemail in reference to upcoming appointment scheduled for 07/14/16. Phone number given for a call back so details instructions can be given. Ronnie Hardy   

## 2016-07-12 NOTE — Patient Instructions (Signed)
Medication Instructions:  None  Labwork: None  Testing/Procedures: Your physician has requested that you have en exercise stress myoview. For further information please visit [email or web address]. Please follow instruction sheet, as given.   Follow-Up: Your physician recommends that you schedule a follow-up appointment in: 6 weeks with Dr. Elease HashimotoNahser.   Any Other Special Instructions Will Be Listed Below (If Applicable).     If you need a refill on your cardiac medications before your next appointment, please call your pharmacy.

## 2016-07-13 DIAGNOSIS — R0789 Other chest pain: Secondary | ICD-10-CM | POA: Insufficient documentation

## 2016-07-14 ENCOUNTER — Ambulatory Visit (HOSPITAL_COMMUNITY): Payer: 59 | Attending: Cardiology

## 2016-07-14 DIAGNOSIS — I2510 Atherosclerotic heart disease of native coronary artery without angina pectoris: Secondary | ICD-10-CM | POA: Diagnosis present

## 2016-07-14 DIAGNOSIS — R079 Chest pain, unspecified: Secondary | ICD-10-CM | POA: Insufficient documentation

## 2016-07-14 DIAGNOSIS — I10 Essential (primary) hypertension: Secondary | ICD-10-CM | POA: Diagnosis not present

## 2016-07-14 DIAGNOSIS — R0789 Other chest pain: Secondary | ICD-10-CM

## 2016-07-14 DIAGNOSIS — Z8249 Family history of ischemic heart disease and other diseases of the circulatory system: Secondary | ICD-10-CM | POA: Diagnosis not present

## 2016-07-14 DIAGNOSIS — R0609 Other forms of dyspnea: Secondary | ICD-10-CM | POA: Insufficient documentation

## 2016-07-14 LAB — MYOCARDIAL PERFUSION IMAGING
CHL CUP NUCLEAR SDS: 0
CHL CUP NUCLEAR SSS: 3
CHL CUP RESTING HR STRESS: 50 {beats}/min
CSEPEDS: 0 s
Estimated workload: 13.5 METS
Exercise duration (min): 12 min
LVDIAVOL: 130 mL (ref 62–150)
LVSYSVOL: 55 mL
MPHR: 150 {beats}/min
NUC STRESS TID: 0.98
Peak HR: 129 {beats}/min
Percent HR: 86 %
RATE: 0.27
SRS: 3

## 2016-07-14 MED ORDER — TECHNETIUM TC 99M TETROFOSMIN IV KIT
32.2000 | PACK | Freq: Once | INTRAVENOUS | Status: AC | PRN
  Administered 2016-07-14: 32.2 via INTRAVENOUS
  Filled 2016-07-14: qty 33

## 2016-07-14 MED ORDER — TECHNETIUM TC 99M TETROFOSMIN IV KIT
10.3000 | PACK | Freq: Once | INTRAVENOUS | Status: AC | PRN
  Administered 2016-07-14: 10.3 via INTRAVENOUS
  Filled 2016-07-14: qty 11

## 2016-07-18 ENCOUNTER — Telehealth: Payer: Self-pay | Admitting: Cardiovascular Disease

## 2016-07-18 NOTE — Telephone Encounter (Signed)
Discussed Ronnie Hardy's low risk myoview on the phone. He has coronary calcifications. Given his coronary calcifications, his goal LDL is < 70. Awaiting lab work from his primary MD  He has resumed working out and has no CP Will see me as scheduled in 6 weeks.    Kristeen MissPhilip Nahser, MD  07/18/2016 2:29 PM    [facility] [address],  [address] [place], [place]  [address] Pager [phone] Phone: [phone]; Fax: [phone]) [phone]

## 2016-07-21 ENCOUNTER — Other Ambulatory Visit (INDEPENDENT_AMBULATORY_CARE_PROVIDER_SITE_OTHER): Payer: PRIVATE HEALTH INSURANCE

## 2016-07-21 ENCOUNTER — Ambulatory Visit (INDEPENDENT_AMBULATORY_CARE_PROVIDER_SITE_OTHER): Payer: PRIVATE HEALTH INSURANCE | Admitting: Pulmonary Disease

## 2016-07-21 ENCOUNTER — Encounter: Payer: Self-pay | Admitting: Pulmonary Disease

## 2016-07-21 ENCOUNTER — Other Ambulatory Visit: Payer: Self-pay | Admitting: Pulmonary Disease

## 2016-07-21 DIAGNOSIS — J302 Other seasonal allergic rhinitis: Secondary | ICD-10-CM | POA: Diagnosis not present

## 2016-07-21 DIAGNOSIS — J45909 Unspecified asthma, uncomplicated: Secondary | ICD-10-CM | POA: Diagnosis not present

## 2016-07-21 DIAGNOSIS — K219 Gastro-esophageal reflux disease without esophagitis: Secondary | ICD-10-CM | POA: Diagnosis not present

## 2016-07-21 DIAGNOSIS — Z77090 Contact with and (suspected) exposure to asbestos: Secondary | ICD-10-CM

## 2016-07-21 DIAGNOSIS — M1990 Unspecified osteoarthritis, unspecified site: Secondary | ICD-10-CM | POA: Insufficient documentation

## 2016-07-21 LAB — CBC WITH DIFFERENTIAL/PLATELET
BASOS PCT: 1.2 % (ref 0.0–3.0)
Basophils Absolute: 0.1 10*3/uL (ref 0.0–0.1)
EOS PCT: 5.3 % — AB (ref 0.0–5.0)
Eosinophils Absolute: 0.3 10*3/uL (ref 0.0–0.7)
HCT: 41.9 % (ref 39.0–52.0)
Hemoglobin: 14.1 g/dL (ref 13.0–17.0)
LYMPHS ABS: 1.1 10*3/uL (ref 0.7–4.0)
Lymphocytes Relative: 17 % (ref 12.0–46.0)
MCHC: 33.6 g/dL (ref 30.0–36.0)
MCV: 91.6 fl (ref 78.0–100.0)
MONOS PCT: 11.5 % (ref 3.0–12.0)
Monocytes Absolute: 0.7 10*3/uL (ref 0.1–1.0)
Neutro Abs: 4.1 10*3/uL (ref 1.4–7.7)
Neutrophils Relative %: 65 % (ref 43.0–77.0)
Platelets: 202 10*3/uL (ref 150.0–400.0)
RBC: 4.57 Mil/uL (ref 4.22–5.81)
RDW: 13.3 % (ref 11.5–15.5)
WBC: 6.3 10*3/uL (ref 4.0–10.5)

## 2016-07-21 NOTE — Progress Notes (Signed)
Subjective:    Patient ID: Ronnie Hardy, male    DOB: Jan 29, 1946, 70 y.o.   MRN: [id number]  HPI The patient reports he was remotely diagnosed with asthma and followed with Dr. Maple Hudson. He reports he had a "bad chest cold" in March. He started using Advair and some of his chest discomfort resolved. However, his chest tightness persisted. He reports he was seen by his PCP and given a course of Prednisone without symptomatic relief. He then subsequently started using an albuterol rescue inhaler without relief. He reports he was found to have some calcification in his coronary arteries and underwent stress test without incident. He reports his symptoms improved with regards to his chest tightness last week. He reports no history of recurrent bronchitis or pneumonia. No breathing problems as a child. He reports he started having problems with his breathing in his 59s. No chest pain currently. He denies any sinus congestion or drainage currently. He has been using Flonase for symptom relief. Denies any wheezing currently. He reports daily reflux and is taking both Pepcid and Zantac. Previously elevated the head of his bed but his wife won't allow it. Does wake up with brash water taste. No fever, chills, or sweats. No rashes or abnormal bruising. He does occasional have abdominal bloating.   Review of Systems He reports his urine stream is slow. Occasional urinary urgency. No hematuria or dysuria. He reports chronic joint pain in his elbows, wrists, back. No joint swelling or erythema. A pertinent 14 point review of systems is negative except as per the history of presenting illness.  Allergies  Allergen Reactions  . Meperidine Nausea And Vomiting  . No Known Allergies   . Anesthetics, Amide Nausea Only    Surgical anesthetic    Current Outpatient Prescriptions on File Prior to Visit  Medication Sig Dispense Refill  . aspirin 81 MG tablet Take 81 mg by mouth daily.    . B Complex Vitamins  (VITAMIN-B COMPLEX) TABS Take by mouth.    . Eszopiclone (ESZOPICLONE) 3 MG TABS Take 3 mg by mouth at bedtime. Take immediately before bedtime     . famotidine (PEPCID) 10 MG tablet Take 10 mg by mouth at bedtime as needed. For heartburn    . Fluticasone-Salmeterol (ADVAIR DISKUS IN) Inhale into the lungs as needed.    Marland Kitchen losartan (COZAAR) 100 MG tablet Take 1 tablet (100 mg total) by mouth daily. 90 tablet 3  . MAGNESIUM PO Take 1 tablet by mouth daily.    . Multiple Vitamin (MULTI-VITAMINS) TABS Take 1 tablet by mouth daily.    . Omega-3 1000 MG CAPS Take by mouth daily.    . ranitidine (ZANTAC) 150 MG tablet Take 150 mg by mouth 2 (two) times daily.    . rosuvastatin (CRESTOR) 5 MG tablet Take 5 mg by mouth daily.  4  . tetrahydrozoline 0.05 % ophthalmic solution Apply 1 drop to eye as needed (allergies).      No current facility-administered medications on file prior to visit.     Past Medical History:  Diagnosis Date  . Arthritis    "all over"  . Asthma   . GERD (gastroesophageal reflux disease)   . Hiatal hernia   . Hypertension   . IBS (irritable bowel syndrome)   . PONV (postoperative nausea and vomiting)   . Postoperative ileus (HCC) 05/22/2014   S/P back OR 05/19/2014    Past Surgical History:  Procedure Laterality Date  . ACHILLES TENDON REPAIR Left  1990's  . BACK SURGERY    . ELBOW SURGERY Bilateral 2000's   "scoped them and took out bone chips"  . EYE SURGERY Left 1980's   "removed splinters"  . POSTERIOR FUSION LUMBAR SPINE, MINIMALLY INVASIVE  04/20/2014  . ROTATOR CUFF REPAIR Left 2002  . SHOULDER ARTHROSCOPY W/ ROTATOR CUFF REPAIR Right 2011  . SHOULDER OPEN ROTATOR CUFF REPAIR Left 1990's; 2000's  . TONSILLECTOMY  1954    Family History  Problem Relation Age of Onset  . Heart disease Mother   . Stroke Mother   . Brain cancer Mother   . Heart disease Father   . Alzheimer's disease Brother   . Asthma Brother   . Asthma Daughter     Social History    Social History  . Marital status: Married    Spouse name: N/A  . Number of children: N/A  . Years of education: N/A   Social History Main Topics  . Smoking status: Passive Smoke Exposure - Never Smoker    Types: Cigarettes  . Smokeless tobacco: Never Used     Comment: Father & Mother when he was a child.   . Alcohol use Yes     Comment: 05/22/2014 "a drink q 2 wk or so"  . Drug use: No  . Sexual activity: Not Currently   Other Topics Concern  . None   Social History Narrative   [facility] (07/21/16):   Originally from [facility]. He has mostly lived in [place]. Has traveled to [place] to visit relatives. 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 No bird exposure. Previously had mold in a [place] that he [place] previously. Rarely ever uses his hot tub. 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 Also enjoys gardening. Does have probable asbestos exposure.       Objective:   Physical Exam BP 126/64 (BP Location: Left Arm, Cuff Size: Normal)   Pulse 63   Ht 5\' 11"  (1.803 m)   Wt 170 lb 9.6 oz (77.4 kg)   SpO2 98%   BMI 23.79 kg/m  General:  Awake. Alert. No acute distress. Well-dressed male. Integument:  Warm & dry. No rash on exposed skin. No bruising on exposed skin. Extremities:  No cyanosis or clubbing.  Lymphatics:  No appreciated cervical or supraclavicular lymphadenoapthy. HEENT:  Moist mucus membranes. No oral ulcers. No scleral injection or icterus. Moderate bilateral nasal turbinate swelling with pale mucosa. Cardiovascular:  Regular rate. No edema. Regular rhythm.  Pulmonary:  Good aeration & clear to auscultation bilaterally. Symmetric chest wall expansion. No accessory muscle use on room air. Abdomen: Soft. Normal bowel sounds. Nondistended. Grossly nontender. Musculoskeletal:  Normal bulk and tone. Hand grip strength 5/5 bilaterally. No joint deformity or effusion appreciated. Mild synovial thickening of  bilateral PIP & DIP joints. Neurological:  CN 2-12 grossly in tact. No meningismus. Moving all 4 extremities equally. Symmetric brachioradialis deep tendon reflexes. Psychiatric:  Mood and affect congruent. Speech normal rhythm, rate & tone.   IMAGING CARDIAC CT 07/01/16 (personally reviewed by me): Limited lung windows reviewed. No pleural thickening or effusion appreciated. Patient did have a 5 mm at least partially solid and 4 mm groundglass nodule within the anterior segment of the right lower lobe. No other parenchymal opacity appreciated. Subcentimeter subcarinal lymphadenopathy. No pericardial effusion appreciated.    Assessment & Plan:  71 y.o. male with long-standing history of adult onset asthma. Patient also has symptoms  consistent with chronic allergic rhinitis. He does not have prior pulmonary function testing but given his absence of symptoms I doubt airway remodeling is present. We did discuss the pathophysiology of airway remodeling and the development of COPD with incompletely controlled asthma. We also discussed his cardiac CT which did show some subcentimeter nodules within his right lower lobe. We will need to perform CT imaging to follow up on the nodules but I suspect these are due to prior exposures. Interestingly, the patient does have a history of a brief exposure to asbestos. I instructed the patient contact my office if he had any recurrence of his symptoms or questions before his next appointment.  1. Asthma:  Checking full pulmonary function testing before next appointment. Holding off on inhaler medication at this time. Checking CBC with differential. 2. Chronic allergic rhinitis:  Checking serum RAST panel. Continuing Flonase. 3. GERD: Continuing Zantac daily at bedtime. Patient declines PPI therapy. Educated on dietary and lifestyle modification. 4. Multiple Lung Nodules on Chest CT: CT chest without contrast. 5. History of asbestos exposure: Brief period one-time event.  Monitoring with chest imaging and pulmonary function testing. 6. Health maintenance:  Status post influenza vaccine October 2017.  Plan to discuss Pneumovax at next appointment.  7. Follow-up:  Return to clinic in 8 weeks or sooner if needed.   Donna ChristenJennings E. Jamison NeighborNestor, M.D. [facility] Pager:  [phone] After 3pm or if no response, call [phone] 9:36 AM 07/21/16

## 2016-07-21 NOTE — Patient Instructions (Addendum)
   Call me if you have any new breathing problems or questions before your next appointment.  Continue using your Flonase for now.   We will review your test results at your next appointment but we will call you to update you on your CT scan result.   TESTS ORDERED: 1. Full PFTs on or before next appointment 2. Serum RAST Panel & CBC w/ differential  3. CT Chest w/o

## 2016-07-22 LAB — RESPIRATORY ALLERGY PROFILE REGION II ~~LOC~~
Allergen, A. alternata, m6: <0.1 kU/L
Allergen, C. Herbarum, M2: <0.1 kU/L
Allergen, Cedar tree, t12: <0.1 kU/L
Allergen, Comm Silver Birch, t9: <0.1 kU/L
Allergen, D pternoyssinus,d7: <0.1 kU/L
Allergen, Mouse Urine Protein, e78: <0.1 kU/L
Allergen, Mulberry, t76: <0.1 kU/L
Allergen, Oak,t7: <0.1 kU/L
Allergen, P. notatum, m1: <0.1 kU/L
Bermuda Grass: <0.1 kU/L
Box Elder IgE: <0.1 kU/L
Cockroach: <0.1 kU/L
Common Ragweed: <0.1 kU/L
D. farinae: <0.1 kU/L
IgE (Immunoglobulin E), Serum: <2 kU/L (ref <115)
Johnson Grass: <0.1 kU/L
Rough Pigweed  IgE: <0.1 kU/L
Sheep Sorrel IgE: <0.1 kU/L

## 2016-07-27 ENCOUNTER — Ambulatory Visit (INDEPENDENT_AMBULATORY_CARE_PROVIDER_SITE_OTHER): Payer: Self-pay | Admitting: Orthopaedic Surgery

## 2016-07-29 ENCOUNTER — Ambulatory Visit (INDEPENDENT_AMBULATORY_CARE_PROVIDER_SITE_OTHER)
Admission: RE | Admit: 2016-07-29 | Discharge: 2016-07-29 | Disposition: A | Discharge status: Home / Self Care | Payer: PRIVATE HEALTH INSURANCE | Source: Ambulatory Visit | Attending: Pulmonary Disease | Admitting: Pulmonary Disease

## 2016-07-29 DIAGNOSIS — J45909 Unspecified asthma, uncomplicated: Secondary | ICD-10-CM | POA: Diagnosis not present

## 2016-08-03 ENCOUNTER — Encounter: Payer: Self-pay | Admitting: *Deleted

## 2016-08-05 NOTE — Progress Notes (Signed)
Spoke with patient and informed him of results. Pt did not have any questions and verbalized understanding. Nothing further is needed.

## 2016-08-24 ENCOUNTER — Encounter (INDEPENDENT_AMBULATORY_CARE_PROVIDER_SITE_OTHER): Payer: Self-pay

## 2016-08-24 ENCOUNTER — Encounter: Payer: Self-pay | Admitting: Cardiovascular Disease

## 2016-08-24 ENCOUNTER — Ambulatory Visit (INDEPENDENT_AMBULATORY_CARE_PROVIDER_SITE_OTHER): Payer: PRIVATE HEALTH INSURANCE | Admitting: Cardiovascular Disease

## 2016-08-24 VITALS — BP 128/74 | HR 54 | Ht 71.0 in | Wt 170.8 lb

## 2016-08-24 DIAGNOSIS — I10 Essential (primary) hypertension: Secondary | ICD-10-CM | POA: Diagnosis not present

## 2016-08-24 DIAGNOSIS — I2584 Coronary atherosclerosis due to calcified coronary lesion: Secondary | ICD-10-CM

## 2016-08-24 DIAGNOSIS — I2510 Atherosclerotic heart disease of native coronary artery without angina pectoris: Secondary | ICD-10-CM | POA: Diagnosis not present

## 2016-08-24 LAB — COMPREHENSIVE METABOLIC PANEL
ALK PHOS: 55 IU/L (ref 39–117)
ALT: 13 IU/L (ref 0–44)
AST: 28 IU/L (ref 0–40)
Albumin/Globulin Ratio: 1.9 (ref 1.2–2.2)
Albumin: 4.2 g/dL (ref 3.5–4.8)
BUN/Creatinine Ratio: 16 (ref 10–24)
BUN: 15 mg/dL (ref 8–27)
Bilirubin Total: 0.3 mg/dL (ref 0.0–1.2)
CALCIUM: 9.2 mg/dL (ref 8.6–10.2)
CO2: 23 mmol/L (ref 20–29)
Chloride: 102 mmol/L (ref 96–106)
Creatinine, Ser: 0.95 mg/dL (ref 0.76–1.27)
GFR calc Af Amer: 93 mL/min/{1.73_m2} (ref >59)
GFR, EST NON AFRICAN AMERICAN: 80 mL/min/{1.73_m2} (ref >59)
Globulin, Total: 2.2 g/dL (ref 1.5–4.5)
Glucose: 66 mg/dL (ref 65–99)
Potassium: 4.5 mmol/L (ref 3.5–5.2)
Sodium: 140 mmol/L (ref 134–144)
Total Protein: 6.4 g/dL (ref 6.0–8.5)

## 2016-08-24 LAB — LIPID PANEL
Chol/HDL Ratio: 1.8 ratio (ref 0.0–5.0)
Cholesterol, Total: 134 mg/dL (ref 100–199)
HDL: 76 mg/dL (ref >39)
LDL CALC: 49 mg/dL (ref 0–99)
Triglycerides: 44 mg/dL (ref 0–149)
VLDL Cholesterol Cal: 9 mg/dL (ref 5–40)

## 2016-08-24 NOTE — Patient Instructions (Signed)

## 2016-08-24 NOTE — Progress Notes (Signed)
Ronnie Hardy Date of Birth  1945/06/04       [facility] Office [address], [address]  [address], [address] [place], [place]  [address]   [place], [place]  [address] [phone]     [phone]   Fax  [phone]    Fax [phone]  Problem List: 1. Hypertension 2. Asthma 3. Coronary artery calcifications   01/07/2013 Ronnie Hardy is a 71 yo with hx of HTN.  Avoids salty or fatty foods.  Works out daily .    He has had HTN for the past 5 years or so.   He has a family hx of heart disease ( mother died of stroke, father died of MI - smoker)   He has throbbing in his throat and then knows that his BP is elevated.   He has not been diagnosed with sleep apnea but admits that he doesn't sleep well.    Feb. 4, 2014: BP has been 130s / 80s.  He was started on Horizant but it seems to be causing some peripheral edema.   Ronnie Hardy is seen back for evaluation of coronary artery calcifications . In March he developed a bid chest cold.   Was still short of breath.   CXR showed some calcifications on the aorta ( and ? Coronary )    A coronary calcium score was 390 which is in the 68% of matched subjects   Was started on a statin and ASA by Dr. Renne Crigler.   Works out 5 times a week. No angina with work outs .   Has some  He goes mountain biking on occasion. Has some dyspnea recently which seems to resolve as he continues to cycle.  The tightness seems to be worse in the afternoon and evening.    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  Had a heart cath by Dr. Lorenda Hatchet 2003.  Cath was normal.     August 24, 2016   Ronnie Hardy is seen today  Had a myoview on Jul 14, 2016 that showed no ischemia and normal LV function Cornaary calcium score was 390 .  No recent chest pain .  No dyspnea    Current Outpatient Prescriptions on File Prior to Visit  Medication Sig Dispense Refill  . aspirin 81 MG tablet Take 81 mg by mouth daily.    . B Complex  Vitamins (VITAMIN-B COMPLEX) TABS Take by mouth as directed.     . Eszopiclone (ESZOPICLONE) 3 MG TABS Take 3 mg by mouth at bedtime. Take immediately before bedtime     . famotidine (PEPCID) 10 MG tablet Take 10 mg by mouth at bedtime as needed. For heartburn    . Fluticasone-Salmeterol (ADVAIR DISKUS IN) Inhale into the lungs as needed (Use as directed).     Marland Kitchen losartan (COZAAR) 100 MG tablet Take 1 tablet (100 mg total) by mouth daily. 90 tablet 3  . MAGNESIUM PO Take 1 tablet by mouth daily.    . Multiple Vitamin (MULTI-VITAMINS) TABS Take 1 tablet by mouth daily.    . Omega-3 1000 MG CAPS Take 1 capsule by mouth daily.     . ranitidine (ZANTAC) 150 MG tablet Take 150 mg by mouth 2 (two) times daily.    . rosuvastatin (CRESTOR) 5 MG tablet Take 5 mg by mouth daily.  4  . tetrahydrozoline 0.05 % ophthalmic solution Apply 1 drop to eye as needed (Use as directed  for allergies).      No current facility-administered medications on file prior to visit.     Allergies  Allergen Reactions  . Meperidine Nausea And Vomiting  . No Known Allergies   . Anesthetics, Amide Nausea Only    Surgical anesthetic    Past Medical History:  Diagnosis Date  . Arthritis    "all over"  . Asthma   . GERD (gastroesophageal reflux disease)   . Hiatal hernia   . Hypertension   . IBS (irritable bowel syndrome)   . PONV (postoperative nausea and vomiting)   . Postoperative ileus (HCC) 05/22/2014   S/P back OR 05/19/2014    Past Surgical History:  Procedure Laterality Date  . ACHILLES TENDON REPAIR Left 1990's  . BACK SURGERY    . ELBOW SURGERY Bilateral 2000's   "scoped them and took out bone chips"  . EYE SURGERY Left 1980's   "removed splinters"  . POSTERIOR FUSION LUMBAR SPINE, MINIMALLY INVASIVE  04/20/2014  . ROTATOR CUFF REPAIR Left 2002  . SHOULDER ARTHROSCOPY W/ ROTATOR CUFF REPAIR Right 2011  . SHOULDER OPEN ROTATOR CUFF REPAIR Left 1990's; 2000's  . TONSILLECTOMY  1954    History   Smoking Status  . Passive Smoke Exposure - Never Smoker  . Types: Cigarettes  Smokeless Tobacco  . Never Used    Comment: Father & Mother when he was a child.     History  Alcohol Use  . Yes    Comment: 05/22/2014 "a drink q 2 wk or so"    Family History  Problem Relation Age of Onset  . Heart disease Mother   . Stroke Mother   . Brain cancer Mother   . Heart disease Father   . Alzheimer's disease Brother   . Asthma Brother   . Asthma Daughter     Reviw of Systems:  Reviewed in the HPI.  All other systems are negative.  Physical Exam: There were no vitals taken for this visit. General: Well developed, well nourished, in no acute distress.  Head: Normocephalic, atraumatic, sclera non-icteric, mucus membranes are moist,  Neck: Supple. Carotids are 2 + without bruits. No JVD Lungs: slight wheezing in the left base Heart: regular rate.  normal  S1 S2. No murmurs, gallops or rubs. Abdomen: Soft, non-tender, non-distended with normal bowel sounds. No hepatomegaly. No rebound/guarding. No masses. Msk:  Strength and tone are normal Extremities: No clubbing or cyanosis. No edema.  Distal pedal pulses are 2+ and equal bilaterally. Neuro: Alert and oriented X 3. Moves all extremities spontaneously. Psych:  Responds to questions appropriately with a normal affect.  ECG: Jul 12, 2016:   Sinus brady with 1st degree AV block   Assessment / Plan:   1.  coronary calcifications: Ronnie Hardy presents today with recent diagnosis of coronary calcifications primarily in the left anterior descending artery. His  Score is 390.  He's not had any episodes of chest discomfort. He has some chest tightness this spring that was probably related to asthma. He had a stress Myoview study that was low risk.  We'll check fasting lipids today. I will see him again in one year for follow-up visit.  2. Hypertension: well controlled    Kristeen MissPhilip Kaya Pottenger, MD  08/24/2016 7:13 AM    [facility]  [facility] [address],  [address] [place], [place]  [address] Pager [phone] Phone: [phone]; Fax: [phone]

## 2016-09-21 ENCOUNTER — Ambulatory Visit (INDEPENDENT_AMBULATORY_CARE_PROVIDER_SITE_OTHER): Payer: PRIVATE HEALTH INSURANCE | Admitting: Pulmonary Disease

## 2016-09-21 ENCOUNTER — Encounter: Payer: Self-pay | Admitting: Pulmonary Disease

## 2016-09-21 VITALS — BP 118/70 | HR 56 | Ht 71.0 in | Wt 171.2 lb

## 2016-09-21 DIAGNOSIS — I2584 Coronary atherosclerosis due to calcified coronary lesion: Secondary | ICD-10-CM

## 2016-09-21 DIAGNOSIS — R918 Other nonspecific abnormal finding of lung field: Secondary | ICD-10-CM

## 2016-09-21 DIAGNOSIS — J45909 Unspecified asthma, uncomplicated: Secondary | ICD-10-CM | POA: Diagnosis not present

## 2016-09-21 DIAGNOSIS — K219 Gastro-esophageal reflux disease without esophagitis: Secondary | ICD-10-CM

## 2016-09-21 DIAGNOSIS — I2510 Atherosclerotic heart disease of native coronary artery without angina pectoris: Secondary | ICD-10-CM

## 2016-09-21 NOTE — Progress Notes (Signed)
Subjective:    Patient ID: Ronnie Hardy, male    DOB: Oct 29, 1945, 71 y.o.   MRN: [id number]  C.C.:  Follow-up for Asthma, Multiple Lung Nodules, Chronic Allergic Rhinitis, GERD, & H/O Asbestos Exposure.  HPI Asthma:  The patient didn't undergo his PFT testing due to increased dyspnea lately. He denies any associated coughing or wheezing.   Multiple Lung Nodules: Seen on cardiac CT in April 2018. Chest CT in May 2018 showed subcentimeter nodules in right middle, left upper, and left lower lobes.No evidence of progression.  Chronic Allergic Rhinitis:  Continued on Flonase at last appointment. He reports he hasn't been using his Flonase as regularly lately. Minimal sinus congestion & drainage.  GERD:  Continued on Zantac at last appointment. Reluctant to try PPI. Continues to have daily reflux and also eats spicy foods. He reports occasional brash water taste from his reflux.   H/O Asbestos Exposure: Patient had a brief, one time exposure.No evidence of pleural or peripheral disease on CT imaging.  Review of Systems No chest pain, tightness or pressure. No fever or chills. No rashes or bruising.   Allergies  Allergen Reactions  . Meperidine Nausea And Vomiting  . No Known Allergies     Has some intolerances  . Anesthetics, Amide Nausea Only    Surgical anesthetic    Current Outpatient Prescriptions on File Prior to Visit  Medication Sig Dispense Refill  . aspirin 81 MG tablet Take 81 mg by mouth daily.    . B Complex Vitamins (VITAMIN-B COMPLEX) TABS Take by mouth as directed.     . Eszopiclone (ESZOPICLONE) 3 MG TABS Take 3 mg by mouth at bedtime. Take immediately before bedtime     . famotidine (PEPCID) 10 MG tablet Take 10 mg by mouth at bedtime as needed. For heartburn    . Fluticasone-Salmeterol (ADVAIR DISKUS IN) Inhale into the lungs as needed (Use as directed).     Marland Kitchen losartan (COZAAR) 100 MG tablet Take 1 tablet (100 mg total) by mouth daily. 90 tablet 3  . MAGNESIUM  PO Take 1 tablet by mouth daily.    . Multiple Vitamin (MULTI-VITAMINS) TABS Take 1 tablet by mouth daily.    . Omega-3 1000 MG CAPS Take 1 capsule by mouth daily.     . ranitidine (ZANTAC) 150 MG tablet Take 150 mg by mouth 2 (two) times daily.    . rosuvastatin (CRESTOR) 5 MG tablet Take 5 mg by mouth daily.  4  . tetrahydrozoline 0.05 % ophthalmic solution Apply 1 drop to eye as needed (Use as directed for allergies).      No current facility-administered medications on file prior to visit.     Past Medical History:  Diagnosis Date  . Arthritis    "all over"  . Asthma   . GERD (gastroesophageal reflux disease)   . Hiatal hernia   . Hypertension   . IBS (irritable bowel syndrome)   . PONV (postoperative nausea and vomiting)   . Postoperative ileus (HCC) 05/22/2014   S/P back OR 05/19/2014    Past Surgical History:  Procedure Laterality Date  . ACHILLES TENDON REPAIR Left 1990's  . BACK SURGERY    . ELBOW SURGERY Bilateral 2000's   "scoped them and took out bone chips"  . EYE SURGERY Left 1980's   "removed splinters"  . POSTERIOR FUSION LUMBAR SPINE, MINIMALLY INVASIVE  04/20/2014  . ROTATOR CUFF REPAIR Left 2002  . SHOULDER ARTHROSCOPY W/ ROTATOR CUFF REPAIR Right 2011  .  SHOULDER OPEN ROTATOR CUFF REPAIR Left 1990's; 2000's  . TONSILLECTOMY  1954    Family History  Problem Relation Age of Onset  . Heart disease Mother   . Stroke Mother   . Brain cancer Mother   . Heart disease Father   . Alzheimer's disease Brother   . Asthma Brother   . Asthma Daughter     Social History   Social History  . Marital status: Married    Spouse name: N/A  . Number of children: N/A  . Years of education: N/A   Social History Main Topics  . Smoking status: Passive Smoke Exposure - Never Smoker    Types: Cigarettes  . Smokeless tobacco: Never Used     Comment: Father & Mother when he was a child.   . Alcohol use Yes     Comment: 05/22/2014 "a drink q 2 wk or so"  . Drug use:  No  . Sexual activity: Not Currently   Other Topics Concern  . None   Social History Narrative   [facility] (07/21/16):   [place] has mostly lived in [place]. Has traveled to [place] to visit relatives. 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 No bird exposure. Previously had mold in a lake house that he had previously. Rarely ever uses his hot tub. 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 Also enjoys gardening. Does have probable asbestos exposure.       Objective:   Physical Exam BP 118/70 (BP Location: Left Arm, Patient Position: Sitting, Cuff Size: Normal)   Pulse (!) 56   Ht 5\' 11"  (1.803 m)   Wt 171 lb 3.2 oz (77.7 kg)   SpO2 98%   BMI 23.88 kg/m   General:  Awake. Alert. No acute distress. Thin, Caucasian male. Integument:  Warm & dry. No rash on exposed skin. Extremities:  No cyanosis or clubbing.  HEENT:  Moist mucus membranes. Moderate bilateral nasal turbinate swelling right greater than left. No oral ulcers Cardiovascular:  Regular rate. No edema. Normal S1 & S2.  Pulmonary:  Clear to auscultation bilaterally. Normal work of breathing on room air. Speaking in complete sentences. Abdomen: Soft. Normal bowel sounds. Nondistended.  Musculoskeletal:  Normal bulk and tone. No joint deformity or effusion appreciated.  IMAGING CT CHEST W/O  07/29/16 (personally reviewed by me):  2 mm left upper lobe, 6 mm right middle lobe, & 5 mm left lower lobe nodules noted. No developing nodule or mass appreciated. No pleural effusion or thickening. No pericardial effusion. No pathologic mediastinal adenopathy.  CARDIAC CT 07/01/16 (previously reviewed by me): Limited lung windows reviewed. No pleural thickening or effusion appreciated. Patient did have a 5 mm at least partially solid and 4 mm groundglass nodule within the anterior segment of the right lower lobe. No other parenchymal opacity appreciated.  Subcentimeter subcarinal lymphadenopathy. No pericardial effusion appreciated.  LABS 07/21/16 CBC:  6.3/14.1/41.9/202 Eosinophils: 0.3 IgE:  <2 RAST Panel:  Negative     Assessment & Plan:  71 y.o. male with underlying asthma and chronic allergic rhinitis. Patient has minimal symptoms at this time. I reviewed his chest CT scan which does show multiple lung nodules, none of which seem to be progressing on repeat imaging. With his family history of malignancy I feel that repeating CT imaging is reasonable given his increased risk. We did discuss performing imaging at 3 months or 6 months per current guidelines.  I instructed the patient contact my office if he had any new breathing problems or questions before his next appointment.   1. Asthma:  Unclear severity. Checking full pulmonary function testing on or before next appointment. 2. Chronic allergic rhinitis: Continuing seasonal use of Flonase. 3. GERD: Suboptimal control with Zantac. No new medications. 4. Multiple lung nodules: Repeat CT chest without contrast August 2018. Further imaging pending this result. 5. History of asbestos exposure: No evidence of pleural or parenchymal disease. Full on a function testing pending. 6. Health maintenance: Plan to address at follow-up. 7. Follow-up: Return to clinic in November or sooner if needed.  Donna Christen Jamison Neighbor, M.D. [facility] Pulmonary [facility] Critical Care Pager:  [phone] After 3pm or if no response, call [phone] 2:01 PM 09/21/16

## 2016-09-21 NOTE — Patient Instructions (Signed)
   Call/e-mail me if you have any new breathing problems or questions before your next appointment.  We will reschedule your breathing tests to your next appointment with me.  We will contact you with your chest CT results after I have reviewed the scan in August.  TESTS ORDERED: 1. CT CHEST W/O August 2018 2. Full PFTs on or before follow-up

## 2016-10-05 ENCOUNTER — Telehealth: Payer: Self-pay | Admitting: Pulmonary Disease

## 2016-10-05 NOTE — Telephone Encounter (Signed)
Had a peer to peer discussion with a Radiologist representing the patient's insurance company. Patient's insurance company guidelines which are not based on national guidelines admitted by the physician herself recommend a repeat CT scan in 6-12 months. We discussed the patient's case as well as his risk of malignancy in the setting of his family history with regards to his mother as well as secondhand smoke exposure. Despite the risk of cancer the patient's insurance company will not approve repeat chest CT imaging before 6 months despite national guidelines.

## 2016-10-06 NOTE — Telephone Encounter (Signed)
Please reschedule the patient's chest CT without for November 2018. Given my previously documented conversation with the peer-to-peer physician. I have made the patient aware of the conversation. He may be filing an appeal of his own but is ok with changing the CT scan to the 6 month time period. I have an appointment with him on 11/19 so if the CT scan could happen prior to that it would be helpful. Thanks.

## 2016-10-07 NOTE — Telephone Encounter (Signed)
This CT has been rescheduled for 01/20/2017 @ 3:00pm

## 2016-10-07 NOTE — Telephone Encounter (Signed)
PCCs, can you all reschedule this patient's CT scan for November 2018 instead of August 2018? Thanks.

## 2016-10-25 ENCOUNTER — Telehealth: Payer: Self-pay | Admitting: Pulmonary Disease

## 2016-10-25 ENCOUNTER — Other Ambulatory Visit: Payer: PRIVATE HEALTH INSURANCE

## 2016-10-25 NOTE — Telephone Encounter (Signed)
Per staff message received from JN "Libby/Alyviah Crandle,   Mr. Randa Evens e-mailed me that he has been approved for his CT Chest w/o contrast. Can you check to see if we received a letter as well. I'm in the Hospital and won't be in office until Thursday. He is available to have the CT scan done this week if it can be arranged.   JN"   Letter was not found in JN's look at area. Pt's insurance was contacted in attempt to fax approval letter; medical department is currently closed. Will try again at later time.

## 2016-12-19 ENCOUNTER — Telehealth: Payer: Self-pay | Admitting: Pulmonary Disease

## 2016-12-19 NOTE — Telephone Encounter (Signed)
Will route message to PCCs.

## 2016-12-20 NOTE — Telephone Encounter (Signed)
I have called the patient about this CT and had to leave a message for him to return my call. It looks like the CT has been scheduled and rescheduled 3 different times

## 2016-12-20 NOTE — Telephone Encounter (Signed)
I have spoken with Ronnie Hardy and rescheduled the CT for 12/27/2016 and he also had to change his appt with Dr. Jamison Neighbor because he would be tied up all day on 12/29/2016 to 01/02/2017

## 2016-12-27 ENCOUNTER — Ambulatory Visit (INDEPENDENT_AMBULATORY_CARE_PROVIDER_SITE_OTHER)
Admission: RE | Admit: 2016-12-27 | Discharge: 2016-12-27 | Disposition: A | Discharge status: Home / Self Care | Payer: PRIVATE HEALTH INSURANCE | Source: Ambulatory Visit | Attending: Pulmonary Disease | Admitting: Pulmonary Disease

## 2016-12-27 DIAGNOSIS — R918 Other nonspecific abnormal finding of lung field: Secondary | ICD-10-CM

## 2016-12-29 ENCOUNTER — Ambulatory Visit: Payer: PRIVATE HEALTH INSURANCE | Admitting: Pulmonary Disease

## 2017-01-02 ENCOUNTER — Encounter: Payer: Self-pay | Admitting: Pulmonary Disease

## 2017-01-02 ENCOUNTER — Ambulatory Visit (INDEPENDENT_AMBULATORY_CARE_PROVIDER_SITE_OTHER): Payer: PRIVATE HEALTH INSURANCE | Admitting: Pulmonary Disease

## 2017-01-02 VITALS — BP 136/76 | HR 58 | Ht 71.0 in | Wt 173.1 lb

## 2017-01-02 DIAGNOSIS — J45909 Unspecified asthma, uncomplicated: Secondary | ICD-10-CM

## 2017-01-02 DIAGNOSIS — I2584 Coronary atherosclerosis due to calcified coronary lesion: Secondary | ICD-10-CM

## 2017-01-02 DIAGNOSIS — R911 Solitary pulmonary nodule: Secondary | ICD-10-CM | POA: Diagnosis not present

## 2017-01-02 DIAGNOSIS — R918 Other nonspecific abnormal finding of lung field: Secondary | ICD-10-CM

## 2017-01-02 DIAGNOSIS — I2510 Atherosclerotic heart disease of native coronary artery without angina pectoris: Secondary | ICD-10-CM

## 2017-01-02 NOTE — Progress Notes (Signed)
Subjective:    Patient ID: Ronnie Hardy, male    DOB: 1945-06-06, 71 y.o.   MRN: [id number]  C.C.:  Follow-up for Asthma, Multiple Lung Nodules, Chronic Allergic Rhinitis, GERD, & H/O Asbestos Exposure.  HPI Asthma: Unclear severity. Patient has yet to undergo pulmonary function testing. He reports minimal coughing. Increased fatigue. No new dyspnea. No wheezing.  Multiple lung nodules: Seen on cardiac CT in April 2018. Chest CT in May 2018 showed subcentimeter nodules in right middle, left upper, and left lower lobes. Still no evidence of progression on repeat CT imaging.  Chronic allergic rhinitis: Prescribed Flonase. He has increased itching in his eyes. Minimal sinus congestion. Not using Flonase currently.  GERD: Prescribed Zantac with reluctance to try proton pump inhibitor. He reports he still has reflux daily that he attributes to his diet.   H/O Asbestos Exposure: Patient had a brief, one time exposure.No evidence of pleural or peripheral disease on CT imaging.  Review of Systems No fever, chills, or sweats recently. No chest pain or pressure. No abdominal pain, nausea or emesis.   Allergies  Allergen Reactions  . Meperidine Nausea And Vomiting  . No Known Allergies     Has some intolerances  . Anesthetics, Amide Nausea Only    Surgical anesthetic    Current Outpatient Prescriptions on File Prior to Visit  Medication Sig Dispense Refill  . B Complex Vitamins (VITAMIN-B COMPLEX) TABS Take by mouth as directed.     . Eszopiclone (ESZOPICLONE) 3 MG TABS Take 3 mg by mouth at bedtime. Take immediately before bedtime     . famotidine (PEPCID) 10 MG tablet Take 10 mg by mouth at bedtime as needed. For heartburn    . Fluticasone-Salmeterol (ADVAIR DISKUS IN) Inhale into the lungs as needed (Use as directed).     Marland Kitchen losartan (COZAAR) 100 MG tablet Take 1 tablet (100 mg total) by mouth daily. 90 tablet 3  . MAGNESIUM PO Take 1 tablet by mouth daily.    . Multiple Vitamin  (MULTI-VITAMINS) TABS Take 1 tablet by mouth daily.    . Omega-3 1000 MG CAPS Take 1 capsule by mouth daily.     . ranitidine (ZANTAC) 150 MG tablet Take 150 mg by mouth 2 (two) times daily.    Marland Kitchen tetrahydrozoline 0.05 % ophthalmic solution Apply 1 drop to eye as needed (Use as directed for allergies).      No current facility-administered medications on file prior to visit.     Past Medical History:  Diagnosis Date  . Arthritis    "all over"  . Asthma   . GERD (gastroesophageal reflux disease)   . Hiatal hernia   . Hypertension   . IBS (irritable bowel syndrome)   . PONV (postoperative nausea and vomiting)   . Postoperative ileus (HCC) 05/22/2014   S/P back OR 05/19/2014    Past Surgical History:  Procedure Laterality Date  . ACHILLES TENDON REPAIR Left 1990's  . BACK SURGERY    . ELBOW SURGERY Bilateral 2000's   "scoped them and took out bone chips"  . EYE SURGERY Left 1980's   "removed splinters"  . POSTERIOR FUSION LUMBAR SPINE, MINIMALLY INVASIVE  04/20/2014  . ROTATOR CUFF REPAIR Left 2002  . SHOULDER ARTHROSCOPY W/ ROTATOR CUFF REPAIR Right 2011  . SHOULDER OPEN ROTATOR CUFF REPAIR Left 1990's; 2000's  . TONSILLECTOMY  1954    Family History  Problem Relation Age of Onset  . Heart disease Mother   . Stroke Mother   .  Brain cancer Mother   . Heart disease Father   . Alzheimer's disease Brother   . Asthma Brother   . Asthma Daughter     Social History   Social History  . Marital status: Married    Spouse name: N/A  . Number of children: N/A  . Years of education: N/A   Social History Main Topics  . Smoking status: Passive Smoke Exposure - Never Smoker    Types: Cigarettes  . Smokeless tobacco: Never Used     Comment: Father & Mother when he was a child.   . Alcohol use Yes     Comment: 05/22/2014 "a drink q 2 wk or so"  . Drug use: No  . Sexual activity: Not Currently   Other Topics Concern  . None   Social History Narrative   [facility]  (07/21/16):   [place] has mostly lived in [place]. Has traveled to [place] to visit relatives. 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 No bird exposure. Previously had mold in a lake house that he had previously. Rarely ever uses his hot tub. 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 Also enjoys gardening. Does have probable asbestos exposure.       Objective:   Physical Exam BP 136/76 (BP Location: Left Arm, Cuff Size: Normal)   Pulse (!) 58   Ht 5\' 11"  (1.803 m)   Wt 173 lb 2 oz (78.5 kg)   SpO2 96%   BMI 24.15 kg/m   General:  Caucasian male. No distress. Comfortable. Integument:  Warm. Dry. No rash. Extremities:  No cyanosis or clubbing.  HEENT:  Minimal nasal turbinate swelling. No scleral icterus. Moist mucous membranes. Cardiovascular:  Regular rate. No edema.  Regular rhythm.  Pulmonary:  Normal work of breathing on room air. Clear with auscultation bilaterally Abdomen: Soft. Normal bowel sounds. Nondistended.  Musculoskeletal:  Normal bulk and tone. No joint deformity or effusion appreciated. Neurological:  Cranial nerves 2-12 grossly in tact. No meningismus. Moving all 4 extremities equally.   IMAGING CT CHEST W/O 12/27/16 (personally reviewed by me):  6 mm right middle lobe nodule, 2 mm left upper lobe nodule, and 5 mm left lower lobe nodules unchanged in size. No new nodule or opacity appreciated. No pleural effusion or thickening. No pathologic mediastinal adenopathy. No pericardial effusion.  CT CHEST W/O  07/29/16 (Previously reviewed by me):  2 mm left upper lobe, 6 mm right middle lobe, & 5 mm left lower lobe nodules noted. No developing nodule or mass appreciated. No pleural effusion or thickening. No pericardial effusion. No pathologic mediastinal adenopathy.  CARDIAC CT 07/01/16 (previously reviewed by me): Limited lung windows reviewed. No pleural thickening or effusion appreciated. Patient did  have a 5 mm at least partially solid and 4 mm groundglass nodule within the anterior segment of the right lower lobe. No other parenchymal opacity appreciated. Subcentimeter subcarinal lymphadenopathy. No pericardial effusion appreciated.  LABS 07/21/16 CBC:  6.3/14.1/41.9/202 Eosinophils: 0.3 IgE:  <2 RAST Panel:  Negative     Assessment & Plan:  71 y.o.  male with long-standing history of asthma and allergic rhinitis. I reviewed his chest CT scanning with him today which shows continued clinical stability in his lung nodules. I believe the severity of his asthma is mild given the intermittent nature of his "exacerbations". We did discuss his pulmonary function testing which may help to further define his lung disease  if he truly does have underlying asthma. He seems to only require inhaler medications intermittently and possibly seasonally. The patient does have a history of malignancy in his mother and passive smoke exposure without personal use. As such, I would recommend repeat CT imaging one year from his initial scan and May as I do feel he is at increased risk. I instructed the patient to contact my office if he had new breathing problems or questions before his next appointment.  1. Asthma:  Full pulmonary function testing pending. Currently asymptomatic. 2. Multiple lung nodules: Repeat CT chest without contrast April-May 2019. 3. Chronic allergic rhinitis: Minimal symptoms. No new medications. 4. GERD: Suboptimal control with Zantac due to diet. No new medications. 5. Health maintenance:  Plan to address at next appointment. 6. Follow-up: Return to clinic in 6 months or sooner if needed.  Donna Christen Jamison Neighbor, M.D. [facility] Pulmonary & Critical Care Pager:  [phone] After 3pm or if no response, call [phone] 5:26 PM 01/02/17

## 2017-01-02 NOTE — Patient Instructions (Addendum)
   We will plan on repeating your Chest CT scan in April.  Call if you have any questions or concerns before your next appointment.   TESTS ORDERED: 1. CT CHEST W/O April-May 2019

## 2017-01-20 ENCOUNTER — Other Ambulatory Visit: Payer: PRIVATE HEALTH INSURANCE

## 2017-01-23 ENCOUNTER — Ambulatory Visit: Payer: PRIVATE HEALTH INSURANCE | Admitting: Pulmonary Disease

## 2017-01-24 ENCOUNTER — Other Ambulatory Visit: Payer: PRIVATE HEALTH INSURANCE

## 2017-05-19 ENCOUNTER — Telehealth: Payer: Self-pay | Admitting: Acute Care

## 2017-05-19 DIAGNOSIS — R918 Other nonspecific abnormal finding of lung field: Secondary | ICD-10-CM

## 2017-05-19 NOTE — Telephone Encounter (Signed)
I have scheduled [facility] on 4/29 at 1:00 - pt will need to arrive at 12:45, no prep.  Please give him appt info when he returns call.

## 2017-05-19 NOTE — Telephone Encounter (Signed)
Left VM for patient today regarding scheduling f/u OV with SG in Mid April/May 2019 Patient is needing OV scheduled with SG, and CT Chest w/o contrast in April/May2019 per JN Will follow up

## 2017-05-24 ENCOUNTER — Other Ambulatory Visit: Payer: Self-pay | Admitting: *Deleted

## 2017-05-24 NOTE — Telephone Encounter (Signed)
Pt on recall list for April so I called him to schedule appt with SG and give him CT appt info.  He states he had tried to call our office back from 3/15 message that was left for him and phone just rang.  Pt very upset because no answer when he called & I had made CT appt that was not convenient for him.  I told him I could give him phone # to reschedule or I would do it for him.  He wanted me to change it.  I told him I would reschedule CT & then call him to schedule follow up appt.  I have resched CT to 5/1 at 4:00 & I have left pt vm to call me back for appt info & to make him follow up appt in our office.

## 2017-06-29 NOTE — Telephone Encounter (Signed)
Patient is scheduled for CT Chest on 5.1.19 and follow up visit with Sarah on 5.2.19 Will sign off

## 2017-07-03 ENCOUNTER — Other Ambulatory Visit: Payer: PRIVATE HEALTH INSURANCE

## 2017-07-05 ENCOUNTER — Ambulatory Visit (INDEPENDENT_AMBULATORY_CARE_PROVIDER_SITE_OTHER)
Admission: RE | Admit: 2017-07-05 | Discharge: 2017-07-05 | Disposition: A | Discharge status: Home / Self Care | Payer: PRIVATE HEALTH INSURANCE | Source: Ambulatory Visit | Attending: Acute Care | Admitting: Acute Care

## 2017-07-05 DIAGNOSIS — R918 Other nonspecific abnormal finding of lung field: Secondary | ICD-10-CM

## 2017-07-06 ENCOUNTER — Encounter: Payer: Self-pay | Admitting: Acute Care

## 2017-07-06 ENCOUNTER — Ambulatory Visit (INDEPENDENT_AMBULATORY_CARE_PROVIDER_SITE_OTHER): Payer: PRIVATE HEALTH INSURANCE | Admitting: Acute Care

## 2017-07-06 DIAGNOSIS — J45909 Unspecified asthma, uncomplicated: Secondary | ICD-10-CM | POA: Diagnosis not present

## 2017-07-06 DIAGNOSIS — R911 Solitary pulmonary nodule: Secondary | ICD-10-CM | POA: Diagnosis not present

## 2017-07-06 NOTE — Progress Notes (Addendum)
History of Present Illness Ronnie Hardy is a 72 y.o. male with Asthma and allergic rhinitis,, Multiple Lung Nodules, Chronic Allergic Rhinitis, GERD, & H/O Asbestos Exposure.He was followed by Dr. Jamison Hardy. We will reassign to Dr. Isaiah Hardy.   07/11/2017  6 month follow up. Pt. Presents for follow up to discuss results of his CT Chest.He uses inhalers on an as needed basis. He uses flonase as needed.He exercises every day. He does not use a rescue inhaler as he does not feel he needs one. His asthma is mild. He uses flonase as needed. CT was reviewed. Nodules are stable.We will do a follow up scan in 1 year.He denies fever, chest pain, orthopnea or hemoptysis. We discussed PFT's as this was suggested by Dr. Jamison Hardy before he left. Pt. Does not feel his asthma is severe enough to have these done and declined.  Test Results: 07/05/2017>> CT Chest IMPRESSION: 1. Stable pulmonary nodules compared with 07/29/2016. The largest measuring 6 mm. If the nodules are stable at time of repeat CT, then future CT at 18-24 months (from 07/29/2016) is considered optional for low-risk patients, but is recommended for high-risk patients. This recommendation follows the consensus statement:    CARDIAC CT 07/01/16 (previously reviewed by me): Limited lung windows reviewed. No pleural thickening or effusion appreciated. Patient did have a 5 mm at least partially solid and 4 mm groundglass nodule within the anterior segment of the right lower lobe. No other parenchymal opacity appreciated. Subcentimeter subcarinal lymphadenopathy. No pericardial effusion appreciated.  LABS 07/21/16 CBC:  6.3/14.1/41.9/202 Eosinophils: 0.3 IgE:  <2 RAST Panel:  Negative       CBC Latest Ref Rng & Units 07/21/2016 05/22/2014  WBC 4.0 - 10.5 K/uL 6.3 6.9  Hemoglobin 13.0 - 17.0 g/dL 16.1 12.1(L)  Hematocrit 39.0 - 52.0 % 41.9 34.2(L)  Platelets 150.0 - 400.0 K/uL 202.0 153    BMP Latest Ref Rng & Units 08/24/2016 05/23/2014  05/22/2014  Glucose 65 - 99 mg/dL 66 99 096(E)  BUN 8 - 27 mg/dL Creatinine 0.76 - 1.27 mg/dL 4.54 0.98 1.19  BUN/Creat Ratio 10 - 24 16 - -  Sodium 134 - 144 mmol/L 140 133(L) 128(L)  Potassium 3.5 - 5.2 mmol/L 4.5 3.4(L) 3.9  Chloride 96 - 106 mmol/L 102 95(L) 91(L)  CO2 20 - 29 mmol/L Calcium 8.6 - 10.2 mg/dL 9.2 9.4 8.7    BNP No results found for: BNP  ProBNP No results found for: PROBNP  PFT No results found for: FEV1PRE, FEV1POST, FVCPRE, FVCPOST, TLC, DLCOUNC, PREFEV1FVCRT, PSTFEV1FVCRT  Ct Chest Wo Contrast  Result Date: 07/06/2017 CLINICAL DATA:  Follow-up lung nodule. EXAM: CT CHEST WITHOUT CONTRAST TECHNIQUE: Multidetector CT imaging of the chest was performed following the standard protocol without IV contrast. COMPARISON:  12/27/2016 FINDINGS: Cardiovascular: The heart size appears normal. No pericardial effusion. Aortic atherosclerosis. Calcification in the LAD coronary artery noted. Mediastinum/Nodes: Normal appearance of the thyroid gland. The trachea appears patent and is midline. Normal appearance of the esophagus. No enlarged mediastinal or hilar lymph nodes. Lungs/Pleura: No pleural effusion. Right upper lobe pulmonary nodule is unchanged measuring 3 mm, image 66/3. Subpleural nodule within the right middle lobe is stable measuring 6 mm, image 110/3. There is an area ground-glass attenuation and subpleural reticulation in the right middle lobe which is similar to 07/29/16. This contains a stable 4 mm solid nodule, image 122/3. Left lower lobe pulmonary nodule measures 5 mm and is unchanged from  the previous exam, image 138/3. Upper Abdomen: No acute abnormality. Musculoskeletal: There is degenerative disc disease and scoliosis involving the thoracic spine. No suspicious bone lesions. IMPRESSION: 1. Stable pulmonary nodules compared with 07/29/2016. The largest measuring 6 mm. If the nodules are stable at time of repeat CT, then future CT at 18-24 months  (from 07/29/2016) is considered optional for low-risk patients, but is recommended for high-risk patients. This recommendation follows the consensus statement: Guidelines for Management of Incidental Pulmonary Nodules Detected on CT Images: From the Fleischner Society 2017; Radiology 2017; 284:228-243. 2. Aortic Atherosclerosis (ICD10-I70.0). Lad coronary artery calcifications noted Electronically Signed   By: Signa Kell M.D.   On: 07/06/2017 09:24     Past medical hx Past Medical History:  Diagnosis Date  . Arthritis    "all over"  . Asthma   . GERD (gastroesophageal reflux disease)   . Hiatal hernia   . Hypertension   . IBS (irritable bowel syndrome)   . PONV (postoperative nausea and vomiting)   . Postoperative ileus (HCC) 05/22/2014   S/P back OR 05/19/2014     Social History   Tobacco Use  . Smoking status: Passive Smoke Exposure - Never Smoker  . Smokeless tobacco: Never Used  . Tobacco comment: Father & Mother when he was a child.   Substance Use Topics  . Alcohol use: Yes    Comment: 05/22/2014 "a drink q 2 wk or so"  . Drug use: No    Ronnie Hardy reports that he is a non-smoker but has been exposed to tobacco smoke. He has never used smokeless tobacco. He reports that he drinks alcohol. He reports that he does not use drugs.  Tobacco Cessation: Never smoker , but passive smoke exposure  Past surgical hx, Family hx, Social hx all reviewed.  Current Outpatient Medications on File Prior to Visit  Medication Sig  . B Complex Vitamins (VITAMIN-B COMPLEX) TABS Take by mouth as directed.   . Eszopiclone (ESZOPICLONE) 3 MG TABS Take 3 mg by mouth at bedtime. Take immediately before bedtime   . famotidine (PEPCID) 10 MG tablet Take 10 mg by mouth at bedtime as needed. For heartburn  . Fluticasone-Salmeterol (ADVAIR DISKUS IN) Inhale into the lungs as needed (Use as directed).   Marland Kitchen losartan (COZAAR) 100 MG tablet Take 1 tablet (100 mg total) by mouth daily.  Marland Kitchen MAGNESIUM PO  Take 1 tablet by mouth daily.  . Multiple Vitamin (MULTI-VITAMINS) TABS Take 1 tablet by mouth daily.  . Omega-3 1000 MG CAPS Take 1 capsule by mouth daily.   . ranitidine (ZANTAC) 150 MG tablet Take 150 mg by mouth 2 (two) times daily.  Marland Kitchen tetrahydrozoline 0.05 % ophthalmic solution Apply 1 drop to eye as needed (Use as directed for allergies).    No current facility-administered medications on file prior to visit.      Allergies  Allergen Reactions  . Meperidine Nausea And Vomiting  . No Known Allergies     Has some intolerances  . Anesthetics, Amide Nausea Only    Surgical anesthetic    Review Of Systems:  Constitutional:   No  weight loss, night sweats,  Fevers, chills, fatigue, or  lassitude.  HEENT:   No headaches,  Difficulty swallowing,  Tooth/dental problems, or  Sore throat,                No sneezing, itching, ear ache, + nasal congestion,+ post nasal drip,   CV:  No chest pain,  Orthopnea, PND, swelling  in lower extremities, anasarca, dizziness, palpitations, syncope.   GI  No heartburn, indigestion, abdominal pain, nausea, vomiting, diarrhea, change in bowel habits, loss of appetite, bloody stools.   Resp: Rare shortness of breath with exertion or at rest.  No excess mucus, no productive cough,  No non-productive cough,  No coughing up of blood.  No change in color of mucus.  No wheezing.  No chest wall deformity  Skin: no rash or lesions.  GU: no dysuria, change in color of urine, no urgency or frequency.  No flank pain, no hematuria   MS:  No joint pain or swelling.  No decreased range of motion.  No back pain.  Psych:  No change in mood or affect. No depression or anxiety.  No memory loss.   Vital Signs BP 122/80 (BP Location: Left Arm, Cuff Size: Normal)   Pulse 61   Ht  (1.803 m)   Wt 170 lb 6.4 oz (77.3 kg)   SpO2 97%   BMI 23.77 kg/m    Physical Exam:  General- No distress,  A&Ox3, pleasant ENT: No sinus tenderness, TM clear, pale nasal  mucosa, no oral exudate,no post nasal drip, no LAN Cardiac: S1, S2, regular rate and rhythm, no murmur Chest: No wheeze/ rales/ dullness; no accessory muscle use, no nasal flaring, no sternal retractions Abd.: Soft Non-tender, ND, BS + Ext: No clubbing cyanosis, edema Neuro:  normal strength Skin: No rashes, warm and dry Psych: normal mood and behavior   Assessment/Plan  Pulmonary nodule Stable per CT Chest 07/2017 Never smoker, but second hand exposure from both patents as a child Plan: We will order a CT chest for 07/2018 to follow the pulmonary nodules. Follow up in 1 year with Dr. Isaiah Hardy ( New doc assignment) Please contact office for sooner follow up if symptoms do not improve or worsen or seek emergency care    Asthma Stable interval Plan: Contiue using Advair as needed. Rescue inhaler as needed for shortness of breath. Consider Zyrtec or Xyzol once daily for allergies Continue Zantac for reflux as you have been doing. Follow up in 1 year with Dr. Isaiah Hardy ( New doc assignment) Please contact office for sooner follow up if symptoms do not improve or worsen or seek emergency care      Bevelyn Ngo, NP 07/11/2017  5:01 PM

## 2017-07-06 NOTE — Patient Instructions (Addendum)
We will order a CT chest for 07/2018 to follow the pulmonary nodules. Contiue using Advair as needed. Rescue inhaler as needed for shortness of breath. Consider Zyrtec or Xyzol once daily for allergies Continue Zantac for reflux as you have been doing. Follow up in 1 year with Dr. Isaiah Serge ( New doc assignment) Please contact office for sooner follow up if symptoms do not improve or worsen or seek emergency care

## 2017-07-11 DIAGNOSIS — R911 Solitary pulmonary nodule: Secondary | ICD-10-CM | POA: Insufficient documentation

## 2017-07-11 NOTE — Assessment & Plan Note (Signed)
Stable interval Plan: Contiue using Advair as needed. Rescue inhaler as needed for shortness of breath. Consider Zyrtec or Xyzol once daily for allergies Continue Zantac for reflux as you have been doing. Follow up in 1 year with Dr. Isaiah Serge ( New doc assignment) Please contact office for sooner follow up if symptoms do not improve or worsen or seek emergency care

## 2017-07-11 NOTE — Assessment & Plan Note (Signed)
Stable per CT Chest 07/2017 Never smoker, but second hand exposure from both patents as a child Plan: We will order a CT chest for 07/2018 to follow the pulmonary nodules. Follow up in 1 year with Dr. Isaiah Serge ( New doc assignment) Please contact office for sooner follow up if symptoms do not improve or worsen or seek emergency care

## 2017-09-20 ENCOUNTER — Telehealth: Payer: Self-pay | Admitting: Acute Care

## 2017-09-20 ENCOUNTER — Encounter: Payer: Self-pay | Admitting: Acute Care

## 2017-09-20 ENCOUNTER — Ambulatory Visit (INDEPENDENT_AMBULATORY_CARE_PROVIDER_SITE_OTHER): Payer: PRIVATE HEALTH INSURANCE | Admitting: Acute Care

## 2017-09-20 ENCOUNTER — Ambulatory Visit (INDEPENDENT_AMBULATORY_CARE_PROVIDER_SITE_OTHER)
Admission: RE | Admit: 2017-09-20 | Discharge: 2017-09-20 | Disposition: A | Discharge status: Home / Self Care | Payer: PRIVATE HEALTH INSURANCE | Source: Ambulatory Visit | Attending: Acute Care | Admitting: Acute Care

## 2017-09-20 VITALS — BP 142/78 | HR 64 | Ht 71.0 in | Wt 176.2 lb

## 2017-09-20 DIAGNOSIS — R911 Solitary pulmonary nodule: Secondary | ICD-10-CM

## 2017-09-20 DIAGNOSIS — J302 Other seasonal allergic rhinitis: Secondary | ICD-10-CM | POA: Diagnosis not present

## 2017-09-20 DIAGNOSIS — R05 Cough: Secondary | ICD-10-CM | POA: Diagnosis not present

## 2017-09-20 DIAGNOSIS — J45909 Unspecified asthma, uncomplicated: Secondary | ICD-10-CM | POA: Diagnosis not present

## 2017-09-20 DIAGNOSIS — R059 Cough, unspecified: Secondary | ICD-10-CM

## 2017-09-20 LAB — NITRIC OXIDE: Nitric Oxide: 14

## 2017-09-20 MED ORDER — AZITHROMYCIN 250 MG PO TABS: ORAL_TABLET | ORAL | 0 refills | Status: DC

## 2017-09-20 MED ORDER — LEVALBUTEROL HCL 0.63 MG/3ML IN NEBU
0.6300 mg | INHALATION_SOLUTION | Freq: Once | RESPIRATORY_TRACT | Status: AC
  Administered 2017-09-20: 0.63 mg via RESPIRATORY_TRACT

## 2017-09-20 MED ORDER — ALBUTEROL SULFATE (2.5 MG/3ML) 0.083% IN NEBU: 2.5000 mg | INHALATION_SOLUTION | Freq: Four times a day (QID) | RESPIRATORY_TRACT | 2 refills | Status: DC | PRN

## 2017-09-20 NOTE — Telephone Encounter (Signed)
Please call patient and make sure he is using his Flonase daily.  Also , he will need PFT's once he is better, as we do not have any in his medical record. Thanks

## 2017-09-20 NOTE — Patient Instructions (Addendum)
CXR today>> Stat Neb treatment now FENO now Z-pack, take as prescribed Continue Advair , use daily while you are flaring. Albuterol nebs use  Up to 4 times daily as needed for shortness of breath or wheezing. Mucinex 1200 mg once daily with a full glass of water. Continue Flonase daily Continue Zantac daily Continue your Nyquil for cough at night. If you need prescription strength cough medication call us. Follow up in 2 weeks with Maralyn SagoSarah NP Will need follow up CT chest 18 months from 07/2017( 01/2019)>> follow up pulmonary nodules Please contact office for sooner follow up if symptoms do not improve or worsen or seek emergency care

## 2017-09-20 NOTE — Assessment & Plan Note (Addendum)
Mild flare>> slow to resolve Pt. Does not use his Advair daily as maintenance  Uses Advair when flaring Does not want to use prednisone Plan: CXR today>> no acute cardio-pulmonary disease Neb treatment now FENO >> 14 ppb Z-pack, take as prescribed Continue Advair , use daily while you are flaring. Albuterol nebs use  Up to 4 times daily as needed for shortness of breath or wheezing. Mucinex 1200 mg once daily with a full glass of water. Continue Flonase daily Continue Zantac daily Consider adding Zyrtec or Xyzol for daily allergy control Continue your Nyquil for cough at night. If you need prescription strength cough medication call us. Follow up in 2 weeks with Maralyn SagoSarah NP Will need PFT's when flare resolves ( No results in Epic) Will need follow up CT chest 18 months from 07/2017( 01/2019)>> follow up pulmonary nodules Please contact office for sooner follow up if symptoms do not improve or worsen or seek emergency care

## 2017-09-20 NOTE — Assessment & Plan Note (Signed)
CT 07/2017 shows stable pulmonary nodules Recommendation 12-18 month follow up per radiology Plan: Repeat CT Chest w/o contast 01/2019

## 2017-09-20 NOTE — Assessment & Plan Note (Signed)
Suspect contributing to flare Plan: Continue Flonase daily Continue Zantac daily Consider adding Zyrtec or Xyzol for daily allergy control Continue your Nyquil for cough at night. If you need prescription for stronger cough medication let us know

## 2017-09-20 NOTE — Progress Notes (Signed)
History of Present Illness Ronnie Hardy is a 72 y.o. male with asthma , chronic allergic rhinitis and pulmonary nodules. He was previously followed by Dr. Jamison Neighbor.   7/17/2019Acute OV: Pt. Presents with an asthma flare. He has had increased wheezing x 3 weeks with new yellow to white mucus with productive cough.He states he has had chest congestion. He has been compliant with his Advair for the last 3 weeks . He does not take this every day, but uses it when he is flaring.He states he has had fever. He is coughing up yellow secretions.He has fatigue and in general feels bad.Using rescue inhaler a few days a week. He is out of his albuterol nebs. He is prednisone adverse.He does not have PFT's documented within his medical record.He denies orthopnea , chest pain, or hemoptysis  Test Results:  IMAGING CXR 09/20/2017 Upper normal heart size. Atherosclerotic calcification and tortuosity of thoracic aorta. Mediastinal contours and pulmonary vascularity normal. Lungs appear hyperinflated but clear. No infiltrate, pleural effusion or pneumothorax. Bones unremarkable.  No acute abnormalities.   CT Chest w/o contrast 07/05/2017 Stable pulmonary nodules compared with 07/29/2016. The largest measuring 6 mm. If the nodules are stable at time of repeat CT, then future CT at 18-24 months (from 07/29/2016) is considered optional for low-risk patients, but is recommended for high-risk patients. Aortic Atherosclerosis (ICD10-I70.0). Lad coronary artery calcifications noted   CARDIAC CT 07/01/16 : Limited lung windows reviewed. No pleural thickening or effusion appreciated. Patient did have a 5 mm at least partially solid and 4 mm groundglass nodule within the anterior segment of the right lower lobe. No other parenchymal opacity appreciated. Subcentimeter subcarinal lymphadenopathy. No pericardial effusion appreciated.    CT CHEST W/O  07/29/16   2 mm left upper lobe, 6 mm right middle  lobe, & 5 mm left lower lobe nodules noted. No developing nodule or mass appreciated. No pleural effusion or thickening. No pericardial effusion. No pathologic mediastinal adenopathy.   LABS 07/21/16 CBC:  6.3/14.1/41.9/202 Eosinophils: 0.3 IgE:  <2 RAST Panel:  Negative         CBC Latest Ref Rng & Units 07/21/2016 05/22/2014  WBC 4.0 - 10.5 K/uL 6.3 6.9  Hemoglobin 13.0 - 17.0 g/dL 16.1 12.1(L)  Hematocrit 39.0 - 52.0 % 41.9 34.2(L)  Platelets 150.0 - 400.0 K/uL 202.0 153    BMP Latest Ref Rng & Units 08/24/2016 05/23/2014 05/22/2014  Glucose 65 - 99 mg/dL 66 99 096(E)  BUN 8 - 27 mg/dL 15 8 11   Creatinine 0.76 - 1.27 mg/dL 4.54 0.98 1.19  BUN/Creat Ratio 10 - 24 16 - -  Sodium 134 - 144 mmol/L 140 133(L) 128(L)  Potassium 3.5 - 5.2 mmol/L 4.5 3.4(L) 3.9  Chloride 96 - 106 mmol/L 102 95(L) 91(L)  CO2 20 - 29 mmol/L 23 29 30   Calcium 8.6 - 10.2 mg/dL 9.2 9.4 8.7    BNP No results found for: BNP  ProBNP No results found for: PROBNP  PFT No results found for: FEV1PRE, FEV1POST, FVCPRE, FVCPOST, TLC, DLCOUNC, PREFEV1FVCRT, PSTFEV1FVCRT  Dg Chest 2 View  Result Date: 09/20/2017 CLINICAL DATA:  Mid chest tightness with productive cough of yellow mucus for 3 weeks, hypertension, asthma EXAM: CHEST - 2 VIEW COMPARISON:  CT chest 07/05/2017 FINDINGS: Upper normal heart size. Atherosclerotic calcification and tortuosity of thoracic aorta. Mediastinal contours and pulmonary vascularity normal. Lungs appear hyperinflated but clear. No infiltrate, pleural effusion or pneumothorax. Bones unremarkable. IMPRESSION: No acute abnormalities. Electronically Signed   By:  Ulyses SouthwardMark  Boles M.D.   On: 09/20/2017 16:30     Past medical hx Past Medical History:  Diagnosis Date  . Arthritis    "all over"  . Asthma   . GERD (gastroesophageal reflux disease)   . Hiatal hernia   . Hypertension   . IBS (irritable bowel syndrome)   . PONV (postoperative nausea and vomiting)   . Postoperative ileus  (HCC) 05/22/2014   S/P back OR 05/19/2014     Social History   Tobacco Use  . Smoking status: Passive Smoke Exposure - Never Smoker  . Smokeless tobacco: Never Used  . Tobacco comment: Father & Mother when he was a child.   Substance Use Topics  . Alcohol use: Yes    Comment: 05/22/2014 "a drink q 2 wk or so"  . Drug use: No    [name] reports that he is a non-smoker but has been exposed to tobacco smoke. He has never used smokeless tobacco. He reports that he drinks alcohol. He reports that he does not use drugs.  Tobacco Cessation: Never smoker Passive exposure as a child  Past surgical hx, Family hx, Social hx all reviewed.  Current Outpatient Medications on File Prior to Visit  Medication Sig  . B Complex Vitamins (VITAMIN-B COMPLEX) TABS Take by mouth as directed.   . Eszopiclone (ESZOPICLONE) 3 MG TABS Take 3 mg by mouth at bedtime. Take immediately before bedtime   . famotidine (PEPCID) 10 MG tablet Take 10 mg by mouth at bedtime as needed. For heartburn  . Fluticasone-Salmeterol (ADVAIR DISKUS IN) Inhale into the lungs as needed (Use as directed).   Marland Kitchen. losartan (COZAAR) 100 MG tablet Take 1 tablet (100 mg total) by mouth daily.  Marland Kitchen. MAGNESIUM PO Take 1 tablet by mouth daily.  . Multiple Vitamin (MULTI-VITAMINS) TABS Take 1 tablet by mouth daily.  . Omega-3 1000 MG CAPS Take 1 capsule by mouth daily.   . ranitidine (ZANTAC) 150 MG tablet Take 150 mg by mouth 2 (two) times daily.  Marland Kitchen. tetrahydrozoline 0.05 % ophthalmic solution Apply 1 drop to eye as needed (Use as directed for allergies).    No current facility-administered medications on file prior to visit.      Allergies  Allergen Reactions  . Meperidine Nausea And Vomiting  . No Known Allergies     Has some intolerances  . Anesthetics, Amide Nausea Only    Surgical anesthetic    Review Of Systems:  Constitutional:   No  weight loss, night sweats,  + Fevers, chills,+  fatigue, or  lassitude.  HEENT:   No  headaches,  Difficulty swallowing,  Tooth/dental problems, or  Sore throat,                No sneezing, itching, ear ache, nasal congestion, +post nasal drip,   CV:  No chest pain,  Orthopnea, PND, swelling in lower extremities, anasarca, dizziness, palpitations, syncope.   GI  No heartburn, indigestion, abdominal pain, nausea, vomiting, diarrhea, change in bowel habits, loss of appetite, bloody stools.   Resp: + shortness of breath with exertion or at rest.  + excess mucus, + productive cough,  No non-productive cough,  No coughing up of blood.  + change in color of mucus( Yellow).  No wheezing.  No chest wall deformity  Skin: no rash or lesions.  GU: no dysuria, change in color of urine, no urgency or frequency.  No flank pain, no hematuria   MS:  No joint pain or swelling.  No decreased range of motion.  No back pain.  Psych:  No change in mood or affect. No depression or anxiety.  No memory loss.   Vital Signs BP (!) 142/78 (BP Location: Left Arm, Cuff Size: Normal)   Pulse 64   Ht 5\' 11"  (1.803 m)   Wt 176 lb 3.2 oz (79.9 kg)   SpO2 99%   BMI 24.57 kg/m    Physical Exam:  General- No distress,  A&Ox3, pleasant ENT: No sinus tenderness, TM clear, pale nasal mucosa, no oral exudate,+ post nasal drip, no LAN Cardiac: S1, S2, regular rate and rhythm, no murmur Chest: No wheeze/ rales/ dullness; no accessory muscle use, no nasal flaring, no sternal retractions, diminished per bases Abd.: Soft Non-tender, ND , BS + Ext: No clubbing cyanosis, edema Neuro:  normal strength, MAE x 4, A&O x 3 Skin: No rashes, warm and dry Psych: normal mood and behavior   Assessment/Plan  Asthma Mild flare>> slow to resolve Pt. Does not use his Advair daily as maintenance  Uses Advair when flaring Plan: CXR today>> Stat Neb treatment now FENO >> 14 ppb Z-pack, take as prescribed Continue Advair , use daily while you are flaring. Albuterol nebs use  Up to 4 times daily as needed for  shortness of breath or wheezing. Mucinex 1200 mg once daily with a full glass of water. Continue Flonase daily Continue Zantac daily Consider adding Zyrtec or Xyzol for daily allergy control Continue your Nyquil for cough at night. If you need prescription strength cough medication call us. Follow up in 2 weeks with Maralyn Sago NP Will need PFT's when flare resolves ( No results in Epic) Will need follow up CT chest 18 months from 07/2017( 01/2019)>> follow up pulmonary nodules Please contact office for sooner follow up if symptoms do not improve or worsen or seek emergency care     Chronic seasonal allergic rhinitis Suspect contributing to flare Plan: Continue Flonase daily Continue Zantac daily Consider adding Zyrtec or Xyzol for daily allergy control Continue your Nyquil for cough at night. If you need prescription for stronger cough medication let us know  Pulmonary nodule CT 07/2017 shows stable pulmonary nodules Recommendation 12-18 month follow up per radiology Plan: Repeat CT Chest w/o contast 01/2019    Bevelyn Ngo, NP 09/20/2017  7:14 PM

## 2017-09-22 ENCOUNTER — Telehealth: Payer: Self-pay | Admitting: Acute Care

## 2017-09-22 MED ORDER — PREDNISONE 10 MG PO TABS: ORAL_TABLET | ORAL | 0 refills | Status: DC

## 2017-09-22 NOTE — Telephone Encounter (Signed)
Called and spoke with patient. He states that the call was made in error. Nothing further needed.

## 2017-09-22 NOTE — Telephone Encounter (Signed)
Attempted to call patient today regarding SG concerns and recommendations below. I did not receive an answer at time of call. I have left a voicemail message for pt to return call. X1

## 2017-09-22 NOTE — Telephone Encounter (Signed)
Please send in pred taper  Prednisone taper; 10 mg tablets: 4 tabs x 2 days, 3 tabs x 2 days, 2 tabs x 2 days 1 tab x 2 days then stop. Thanks

## 2017-09-22 NOTE — Telephone Encounter (Signed)
Rx has been sent in. Pt is aware. Nothing further was needed. 

## 2017-09-22 NOTE — Telephone Encounter (Signed)
Spoke with pt. States when he saw Maralyn SagoSarah yesterday, she offered prednisone but he declined. He has changed his mind and would like this prescription.  Maralyn SagoSarah - please advise. Thanks.

## 2017-10-03 NOTE — Telephone Encounter (Signed)
Pt has appt tomorrow. Will close encounter.

## 2017-10-04 ENCOUNTER — Encounter: Payer: Self-pay | Admitting: Nurse Practitioner

## 2017-10-04 ENCOUNTER — Ambulatory Visit (INDEPENDENT_AMBULATORY_CARE_PROVIDER_SITE_OTHER): Payer: PRIVATE HEALTH INSURANCE | Admitting: Nurse Practitioner

## 2017-10-04 ENCOUNTER — Telehealth: Payer: Self-pay | Admitting: Acute Care

## 2017-10-04 VITALS — BP 130/84 | HR 61 | Ht 71.0 in | Wt 173.0 lb

## 2017-10-04 DIAGNOSIS — J302 Other seasonal allergic rhinitis: Secondary | ICD-10-CM | POA: Diagnosis not present

## 2017-10-04 DIAGNOSIS — R911 Solitary pulmonary nodule: Secondary | ICD-10-CM | POA: Diagnosis not present

## 2017-10-04 DIAGNOSIS — J45909 Unspecified asthma, uncomplicated: Secondary | ICD-10-CM

## 2017-10-04 MED ORDER — PREDNISONE 10 MG PO TABS: ORAL_TABLET | ORAL | 0 refills | Status: DC

## 2017-10-04 MED ORDER — FLUTICASONE-SALMETEROL 230-21 MCG/ACT IN AERO: 2.0000 | INHALATION_SPRAY | Freq: Two times a day (BID) | RESPIRATORY_TRACT | 12 refills | Status: DC

## 2017-10-04 NOTE — Patient Instructions (Addendum)
Continue nebulizer as directed Continue flonase PFT needed when feeling better [facility] - take daily as directed Will order sputum for viral respiratory panel - given specimen cup and instructions Spirometry in office today is normal Clean exercise equipment at gym before use May take zyrtec Follow up in 2 weeks

## 2017-10-04 NOTE — Assessment & Plan Note (Signed)
Follow up CT scheduled 07-25-18

## 2017-10-04 NOTE — Assessment & Plan Note (Signed)
Continue current medications. 

## 2017-10-04 NOTE — Progress Notes (Signed)
@Patient  ID: Ronnie Hardy, male    DOB: 07/15/1945, 72 y.o.   MRN: [id number]  Chief Complaint  Patient presents with  . Follow-up    prednisone did not break the asthma flare, SOB, unable to get anything up     Referring provider: Merri Brunette, MD  HPI: Asthma  He complains of cough, shortness of breath and sputum production (small amount of white sputum). There is no hemoptysis. This is a recurrent problem. The current episode started 1 to 4 weeks ago. The problem occurs constantly. The problem has been unchanged. The cough is productive of sputum. Associated symptoms include dyspnea on exertion. Pertinent negatives include no appetite change, chest pain, fever, nasal congestion or rhinorrhea. His symptoms are aggravated by any activity. His symptoms are alleviated by rest (azithromycin, prednisone taper, advair). He reports no improvement on treatment. His symptoms are not alleviated by oral steroids. His past medical history is significant for asthma.     Recent [place] [facility] Encounters:  OV 09-20-17 Asthma Mild flare>> slow to resolve Pt. Does not use his Advair daily as maintenance  Uses Advair when flaring Plan: CXR today>> Stat Neb treatment now FENO >> 14 ppb Z-pack, take as prescribed Continue Advair , use daily while you are flaring. Albuterol nebs use  Up to 4 times daily as needed for shortness of breath or wheezing. Mucinex 1200 mg once daily with a full glass of water. Continue Flonase daily Continue Zantac daily Consider adding Zyrtec or Xyzol for daily allergy control Continue your Nyquil for cough at night. If you need prescription strength cough medication call us. Follow up in 2 weeks with Maralyn Sago NP Will need PFT's when flare resolves ( No results in Epic) Will need follow up CT chest 18 months from 07/2017( 01/2019)>> follow up pulmonary nodules Please contact office for sooner follow up if symptoms do not improve or worsen or seek emergency  care     Tests: Spirometry 10-04-17 normal Imaging:  Chest xray 09-20-17 IMPRESSION: No acute abnormalities.  CT 07-06-17 IMPRESSION: No acute abnormalities.        Allergies  Allergen Reactions  . Meperidine Nausea And Vomiting  . No Known Allergies     Has some intolerances  . Anesthetics, Amide Nausea Only    Surgical anesthetic    Immunization History  Administered Date(s) Administered  . Influenza Split 12/22/2015  . Influenza-Unspecified 12/05/2013    Past Medical History:  Diagnosis Date  . Arthritis    "all over"  . Asthma   . GERD (gastroesophageal reflux disease)   . Hiatal hernia   . Hypertension   . IBS (irritable bowel syndrome)   . PONV (postoperative nausea and vomiting)   . Postoperative ileus (HCC) 05/22/2014   S/P back OR 05/19/2014    Tobacco History: Social History   Tobacco Use  Smoking Status Passive Smoke Exposure - Never Smoker  Smokeless Tobacco Never Used  Tobacco Comment   Father & Mother when he was a child.    Counseling given: NEVER SMOKER Comment: Father & Mother when he was a child.    Outpatient Encounter Medications as of 10/04/2017  Medication Sig  . albuterol (PROVENTIL) (2.5 MG/3ML) 0.083% nebulizer solution Take 3 mLs (2.5 mg total) by nebulization every 6 (six) hours as needed for wheezing or shortness of breath.  . B Complex Vitamins (VITAMIN-B COMPLEX) TABS Take by mouth as directed.   . Eszopiclone (ESZOPICLONE) 3 MG TABS Take 3 mg by mouth at bedtime.  Take immediately before bedtime   . famotidine (PEPCID) 10 MG tablet Take 10 mg by mouth at bedtime as needed. For heartburn  . losartan (COZAAR) 100 MG tablet Take 1 tablet (100 mg total) by mouth daily.  Marland Kitchen MAGNESIUM PO Take 1 tablet by mouth daily.  . Multiple Vitamin (MULTI-VITAMINS) TABS Take 1 tablet by mouth daily.  . Omega-3 1000 MG CAPS Take 1 capsule by mouth daily.   . predniSONE (DELTASONE) 10 MG tablet 4 tabs x 2 days, 3 tabs x 2 days, 2 tabs x 2 days  1 tab x 2 days then stop  . ranitidine (ZANTAC) 150 MG tablet Take 150 mg by mouth 2 (two) times daily.  Marland Kitchen tetrahydrozoline 0.05 % ophthalmic solution Apply 1 drop to eye as needed (Use as directed for allergies).   . [DISCONTINUED] azithromycin (ZITHROMAX) 250 MG tablet Take 2 pills today then one a day for 4 additional days  . [DISCONTINUED] Fluticasone-Salmeterol (ADVAIR DISKUS IN) Inhale into the lungs as needed (Use as directed).   . [DISCONTINUED] predniSONE (DELTASONE) 10 MG tablet 4 tabs x 2 days, 3 tabs x 2 days, 2 tabs x 2 days 1 tab x 2 days then stop  . fluticasone-salmeterol (ADVAIR HFA) 230-21 MCG/ACT inhaler Inhale 2 puffs into the lungs 2 (two) times daily.   No facility-administered encounter medications on file as of 10/04/2017.      Review of Systems  Review of Systems  Constitutional: Negative for appetite change and fever.  HENT: Negative for rhinorrhea.   Respiratory: Positive for cough, sputum production (small amount of white sputum) and shortness of breath. Negative for hemoptysis.   Cardiovascular: Positive for dyspnea on exertion. Negative for chest pain.  Skin: Negative.   Neurological: Negative.   Psychiatric/Behavioral: Negative.        Physical Exam  BP 130/84 (BP Location: Left Arm, Cuff Size: Normal)   Pulse 61   Ht 5\' 11"  (1.803 m)   Wt 173 lb (78.5 kg)   SpO2 97%   BMI 24.13 kg/m   Wt Readings from Last 5 Encounters:  10/04/17 173 lb (78.5 kg)  09/20/17 176 lb 3.2 oz (79.9 kg)  07/06/17 170 lb 6.4 oz (77.3 kg)  01/02/17 173 lb 2 oz (78.5 kg)  09/21/16 171 lb 3.2 oz (77.7 kg)     Physical Exam  Constitutional: He is oriented to person, place, and time. He appears well-developed and well-nourished. No distress.  Cardiovascular: Normal rate and regular rhythm.  Pulmonary/Chest: Effort normal and breath sounds normal. He has no wheezes.  Neurological: He is alert and oriented to person, place, and time.  Skin: Skin is warm and dry.    Psychiatric: He has a normal mood and affect.  Nursing note and vitals reviewed.    Lab Results:  CBC    Component Value Date/Time   WBC 6.3 07/21/2016 1350   RBC 4.57 07/21/2016 1350   HGB 14.1 07/21/2016 1350   HCT 41.9 07/21/2016 1350   PLT 202.0 07/21/2016 1350   MCV 91.6 07/21/2016 1350   MCH 31.3 05/22/2014 1900   MCHC 33.6 07/21/2016 1350   RDW 13.3 07/21/2016 1350   LYMPHSABS 1.1 07/21/2016 1350   MONOABS 0.7 07/21/2016 1350   EOSABS 0.3 07/21/2016 1350   BASOSABS 0.1 07/21/2016 1350    BMET    Component Value Date/Time   NA 140 08/24/2016 1051   K 4.5 08/24/2016 1051   CL 102 08/24/2016 1051   CO2 23 08/24/2016 1051  GLUCOSE 66 08/24/2016 1051   GLUCOSE 99 05/23/2014 0652   BUN 15 08/24/2016 1051   CREATININE 0.95 08/24/2016 1051   CALCIUM 9.2 08/24/2016 1051   GFRNONAA 80 08/24/2016 1051   GFRAA 93 08/24/2016 1051    BNP No results found for: BNP  ProBNP No results found for: PROBNP  Imaging: Dg Chest 2 View  Result Date: 09/20/2017 CLINICAL DATA:  Mid chest tightness with productive cough of yellow mucus for 3 weeks, hypertension, asthma EXAM: CHEST - 2 VIEW COMPARISON:  CT chest 07/05/2017 FINDINGS: Upper normal heart size. Atherosclerotic calcification and tortuosity of thoracic aorta. Mediastinal contours and pulmonary vascularity normal. Lungs appear hyperinflated but clear. No infiltrate, pleural effusion or pneumothorax. Bones unremarkable. IMPRESSION: No acute abnormalities. Electronically Signed   By: Ulyses SouthwardMark  Boles M.D.   On: 09/20/2017 16:30     Assessment & Plan:   Asthma Continue nebulizer as directed Continue flonase PFT needed when feeling better Will [facility] - take daily as directed Will order sputum for viral respiratory panel - given specimen cup and instructions Spirometry in office today is normal Clean exercise equipment at gym before use May take zyrtec   Chronic seasonal allergic rhinitis Continue  current medications  Pulmonary nodule Follow up CT scheduled 07-25-18     Ivonne Andrewonya S Tiran Sauseda, NP 10/04/2017

## 2017-10-04 NOTE — Assessment & Plan Note (Signed)
Continue nebulizer as directed Continue flonase PFT needed when feeling better Will change Advair to [facility] - take daily as directed Will order sputum for viral respiratory panel - given specimen cup and instructions Spirometry in office today is normal Clean exercise equipment at gym before use May take zyrtec

## 2017-10-04 NOTE — Telephone Encounter (Signed)
This patient has a follow up scan ordered for 01/2019. Can you please re-order it for 07/2018. I don't want to wait 18 months since the last scan. Thanks TEPPCO PartnersDenise

## 2017-10-06 NOTE — Telephone Encounter (Signed)
CT scan is ordered for 07/2018.  Nothing further needed.

## 2017-10-18 ENCOUNTER — Encounter: Payer: Self-pay | Admitting: Nurse Practitioner

## 2017-10-18 ENCOUNTER — Ambulatory Visit (INDEPENDENT_AMBULATORY_CARE_PROVIDER_SITE_OTHER): Payer: PRIVATE HEALTH INSURANCE | Admitting: Nurse Practitioner

## 2017-10-18 VITALS — BP 132/76 | HR 60 | Ht 70.5 in | Wt 170.2 lb

## 2017-10-18 DIAGNOSIS — J45909 Unspecified asthma, uncomplicated: Secondary | ICD-10-CM | POA: Diagnosis not present

## 2017-10-18 DIAGNOSIS — J302 Other seasonal allergic rhinitis: Secondary | ICD-10-CM

## 2017-10-18 MED ORDER — BUDESONIDE-FORMOTEROL FUMARATE 160-4.5 MCG/ACT IN AERO: 2.0000 | INHALATION_SPRAY | Freq: Two times a day (BID) | RESPIRATORY_TRACT | 6 refills | Status: DC

## 2017-10-18 NOTE — Progress Notes (Signed)
@Patient  ID: Ronnie Hardy, male    DOB: January 26, 1946, 72 y.o.   MRN: [id number]  Chief Complaint  Patient presents with  . Follow-up    Asthma     Referring provider: Merri Brunette, MD  HPI Patient presents today with ongoing shortness of breath and chest tightness. He has completed a round of antibiotics and 2 rounds of prednisone. He states that he does not feel any improved. Takes Advair daily and has rescue inhaler and Proventil nebs as needed. Denies any fever.   Recent [place] [facility] Encounters:   OV 10-04-17 Asthma Continue nebulizer as directed Continue flonase PFT needed when feeling better Will change Advair to [facility] - take daily as directed Will order sputum for viral respiratory panel - given specimen cup and instructions Spirometry in office today is normal Clean exercise equipment at gym before use May take zyrtec   Chronic seasonal allergic rhinitis Continue current medications  Pulmonary nodule Follow up CT scheduled 07-25-18  OV 09-20-17 Asthma Mild flare>> slow to resolve Pt. Does not use his Advair daily as maintenance  Uses Advair when flaring Plan: CXR today>> Stat Neb treatment now FENO>> 14 ppb Z-pack, take as prescribed Continue Advair , use daily while you are flaring. Albuterol nebs use Up to 4 times daily as needed for shortness of breath or wheezing. Mucinex 1200 mg once daily with a full glass of water. Continue Flonase daily Continue Zantac daily Consider adding Zyrtec or Xyzol for daily allergy control Continue your Nyquil for cough at night. If you need prescription strength cough medication call us. Follow up in 2 weeks with Maralyn Sago NP Will need PFT's when flare resolves ( No results in Epic) Will need follow up CT chest 18 months from 07/2017( 01/2019)>> follow up pulmonary nodules Please contact office for sooner follow up if symptoms do not improve or worsen or seek emergency care  Tests:  Spirometry 10-18-17  normal Spirometry 10-04-17 normal Imaging:  Chest xray 09-20-17 IMPRESSION: No acute abnormalities.  CT 07-06-17 IMPRESSION: No acute abnormalities.   Allergies  Allergen Reactions  . Meperidine Nausea And Vomiting  . No Known Allergies     Has some intolerances  . Anesthetics, Amide Nausea Only    Surgical anesthetic    Immunization History  Administered Date(s) Administered  . Influenza Split 12/22/2015  . Influenza-Unspecified 12/05/2013    Past Medical History:  Diagnosis Date  . Arthritis    "all over"  . Asthma   . GERD (gastroesophageal reflux disease)   . Hiatal hernia   . Hypertension   . IBS (irritable bowel syndrome)   . PONV (postoperative nausea and vomiting)   . Postoperative ileus (HCC) 05/22/2014   S/P back OR 05/19/2014    Tobacco History: Social History   Tobacco Use  Smoking Status Passive Smoke Exposure - Never Smoker  Smokeless Tobacco Never Used  Tobacco Comment   Father & Mother when he was a child.    Counseling given: Never smoker Comment: Father & Mother when he was a child.    Outpatient Encounter Medications as of 10/18/2017  Medication Sig  . albuterol (PROVENTIL) (2.5 MG/3ML) 0.083% nebulizer solution Take 3 mLs (2.5 mg total) by nebulization every 6 (six) hours as needed for wheezing or shortness of breath.  . B Complex Vitamins (VITAMIN-B COMPLEX) TABS Take by mouth as directed.   . Eszopiclone (ESZOPICLONE) 3 MG TABS Take 3 mg by mouth at bedtime. Take immediately before bedtime   . famotidine (PEPCID) 10  MG tablet Take 10 mg by mouth at bedtime as needed. For heartburn  . losartan (COZAAR) 100 MG tablet Take 1 tablet (100 mg total) by mouth daily.  Marland Kitchen. MAGNESIUM PO Take 1 tablet by mouth daily.  . Multiple Vitamin (MULTI-VITAMINS) TABS Take 1 tablet by mouth daily.  . Omega-3 1000 MG CAPS Take 1 capsule by mouth daily.   . predniSONE (DELTASONE) 10 MG tablet 4 tabs x 2 days, 3 tabs x 2 days, 2 tabs x 2 days 1 tab x 2 days then  stop  . ranitidine (ZANTAC) 150 MG tablet Take 150 mg by mouth 2 (two) times daily.  Marland Kitchen. tetrahydrozoline 0.05 % ophthalmic solution Apply 1 drop to eye as needed (Use as directed for allergies).   . [DISCONTINUED] fluticasone-salmeterol (ADVAIR HFA) 230-21 MCG/ACT inhaler Inhale 2 puffs into the lungs 2 (two) times daily.  . budesonide-formoterol (SYMBICORT) 160-4.5 MCG/ACT inhaler Inhale 2 puffs into the lungs 2 (two) times daily.   No facility-administered encounter medications on file as of 10/18/2017.      Review of Systems  Review of Systems  Constitutional: Negative.   HENT: Negative.   Respiratory: Positive for shortness of breath. Negative for cough.   Cardiovascular: Negative.   Gastrointestinal: Negative.   Allergic/Immunologic: Negative.   Neurological: Negative.   Psychiatric/Behavioral: Negative.        Physical Exam  BP 132/76 (BP Location: Left Arm, Patient Position: Sitting, Cuff Size: Normal)   Pulse 60   Ht 5' 10.5" (1.791 m)   Wt 170 lb 3.2 oz (77.2 kg)   SpO2 99%   BMI 24.08 kg/m   Wt Readings from Last 5 Encounters:  10/18/17 170 lb 3.2 oz (77.2 kg)  10/04/17 173 lb (78.5 kg)  09/20/17 176 lb 3.2 oz (79.9 kg)  07/06/17 170 lb 6.4 oz (77.3 kg)  01/02/17 173 lb 2 oz (78.5 kg)     Physical Exam  Constitutional: He is oriented to person, place, and time. He appears well-developed and well-nourished. No distress.  Cardiovascular: Normal rate and regular rhythm.  Pulmonary/Chest: Effort normal and breath sounds normal. He has no wheezes. He has no rales.  Neurological: He is alert and oriented to person, place, and time.  Skin: Skin is warm and dry.  Psychiatric: He has a normal mood and affect.  Nursing note and vitals reviewed.    Lab Results:  CBC    Component Value Date/Time   WBC 6.3 07/21/2016 1350   RBC 4.57 07/21/2016 1350   HGB 14.1 07/21/2016 1350   HCT 41.9 07/21/2016 1350   PLT 202.0 07/21/2016 1350   MCV 91.6 07/21/2016 1350    MCH 31.3 05/22/2014 1900   MCHC 33.6 07/21/2016 1350   RDW 13.3 07/21/2016 1350   LYMPHSABS 1.1 07/21/2016 1350   MONOABS 0.7 07/21/2016 1350   EOSABS 0.3 07/21/2016 1350   BASOSABS 0.1 07/21/2016 1350    BMET    Component Value Date/Time   NA 140 08/24/2016 1051   K 4.5 08/24/2016 1051   CL 102 08/24/2016 1051   CO2 23 08/24/2016 1051   GLUCOSE 66 08/24/2016 1051   GLUCOSE 99 05/23/2014 0652   BUN 15 08/24/2016 1051   CREATININE 0.95 08/24/2016 1051   CALCIUM 9.2 08/24/2016 1051   GFRNONAA 80 08/24/2016 1051   GFRAA 93 08/24/2016 1051    BNP No results found for: BNP  ProBNP No results found for: PROBNP  Imaging: Dg Chest 2 View  Result Date: 09/20/2017 CLINICAL DATA:  Mid chest tightness with productive cough of yellow mucus for 3 weeks, hypertension, asthma EXAM: CHEST - 2 VIEW COMPARISON:  CT chest 07/05/2017 FINDINGS: Upper normal heart size. Atherosclerotic calcification and tortuosity of thoracic aorta. Mediastinal contours and pulmonary vascularity normal. Lungs appear hyperinflated but clear. No infiltrate, pleural effusion or pneumothorax. Bones unremarkable. IMPRESSION: No acute abnormalities. Electronically Signed   By: Ulyses SouthwardMark  Boles M.D.   On: 09/20/2017 16:30   EKG: frequent PVC's noted, 1st degree AV block.    Assessment & Plan:   Asthma Patient Instructions  Spirometry looked normal in office today EKG showed - sinus rhythm 1st degree AV block with frequent ectopic vent beat Please follow up with cardiology to rule out under lying heart issues Will try switching Advair to Symbicort Follow up in 1 week with Dr. Patrici Rankslere    Chronic seasonal allergic rhinitis May take zyrtec     Ivonne Andrewonya S Hedda Crumbley, NP 10/18/2017

## 2017-10-18 NOTE — Patient Instructions (Addendum)
Spirometry looked normal in office today EKG showed - sinus rhythm 1st degree AV block with frequent ectopic vent beat Please follow up with cardiology to rule out under lying heart issues Will try switching Advair to Symbicort Follow up in 1 week with Dr. Patrici Rankslere

## 2017-10-18 NOTE — Assessment & Plan Note (Signed)
Patient Instructions  Spirometry looked normal in office today EKG showed - sinus rhythm 1st degree AV block with frequent ectopic vent beat Please follow up with cardiology to rule out under lying heart issues Will try switching Advair to Symbicort Follow up in 1 week with Dr. Patrici Rankslere

## 2017-10-18 NOTE — Assessment & Plan Note (Signed)
May take zyrtec

## 2017-10-19 ENCOUNTER — Telehealth: Payer: Self-pay | Admitting: Cardiovascular Disease

## 2017-10-19 NOTE — Telephone Encounter (Signed)
Spoke with patient and scheduled him to see Dr. Elease HashimotoNahser on 8/20. He thanked me for the call.

## 2017-10-19 NOTE — Telephone Encounter (Signed)
New Message   Pt c/o Shortness Of Breath: STAT if SOB developed within the last 24 hours or pt is noticeably SOB on the phone  1. Are you currently SOB (can you hear that pt is SOB on the phone)?   2. How long have you been experiencing SOB? Over 6 weeks  3. Are you SOB when sitting or when up moving around? no  4. Are you currently experiencing any other symptoms? Pt states that he has been going to his pulmonary doc and they have been trying to figure out why he is having sob with chest tightness so they advise him to see his cardiologist. Jovita GammaGave pt 1st available date but he wants sooner appt. Please call

## 2017-10-24 ENCOUNTER — Encounter: Payer: Self-pay | Admitting: Cardiovascular Disease

## 2017-10-24 ENCOUNTER — Ambulatory Visit (INDEPENDENT_AMBULATORY_CARE_PROVIDER_SITE_OTHER): Payer: PRIVATE HEALTH INSURANCE | Admitting: Cardiovascular Disease

## 2017-10-24 ENCOUNTER — Ambulatory Visit: Payer: PRIVATE HEALTH INSURANCE | Admitting: Cardiovascular Disease

## 2017-10-24 ENCOUNTER — Encounter (INDEPENDENT_AMBULATORY_CARE_PROVIDER_SITE_OTHER): Payer: Self-pay

## 2017-10-24 VITALS — BP 126/76 | HR 73 | Ht 70.5 in | Wt 174.6 lb

## 2017-10-24 DIAGNOSIS — R0602 Shortness of breath: Secondary | ICD-10-CM

## 2017-10-24 DIAGNOSIS — I2510 Atherosclerotic heart disease of native coronary artery without angina pectoris: Secondary | ICD-10-CM | POA: Diagnosis not present

## 2017-10-24 MED ORDER — ROSUVASTATIN CALCIUM 5 MG PO TABS: 5.0000 mg | ORAL_TABLET | ORAL | 3 refills | Status: DC

## 2017-10-24 NOTE — Progress Notes (Signed)
Ronnie Hardy Date of Birth  10/18/1945       [facility] Office [address], [address]  [address] [place], [place]  [address]   [place], [place]  [address] [phone]     [phone]   Fax  [phone]    Fax [phone]  Problem List: 1. Hypertension 2. Asthma 3. Coronary artery calcifications   01/07/2013 Ronnie Hardy is a 72 yo with hx of HTN.  Avoids salty or fatty foods.  Works out daily .    He has had HTN for the past 5 years or so.   He has a family hx of heart disease ( mother died of stroke, father died of MI - smoker)   He has throbbing in his throat and then knows that his BP is elevated.   He has not been diagnosed with sleep apnea but admits that he doesn't sleep well.    Feb. 4, 2014: BP has been 130s / 80s.  He was started on Horizant but it seems to be causing some peripheral edema.   Ronnie Hardy is seen back for evaluation of coronary artery calcifications . In March he developed a bid chest cold.   Was still short of breath.   CXR showed some calcifications on the aorta ( and ? Coronary )    A coronary calcium score was 390 which is in the 68% of matched subjects   Was started on a statin and ASA by Dr. Renne Crigler.   Works out 5 times a week. No angina with work outs .   Has some  He goes mountain biking on occasion. Has some dyspnea recently which seems to resolve as he continues to cycle.  The tightness seems to be worse in the afternoon and evening.    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  Had a heart cath by Dr. Lorenda Hatchet 2003.  Cath was normal.     August 24, 2016   Ronnie Hardy is seen today  Had a myoview on Jul 14, 2016 that showed no ischemia and normal LV function Cornaary calcium score was 390 .  No recent chest pain .  No dyspnea   Aug. 20, 2019:  Ronnie Hardy is seen today for eval of his contiued shortness of breath .  Has a hx of asthma. Saw pulmonary last week  Spirometry was normal in the  office Denies any chest pain  Has had dyspnea for 7-8 weeks,   Had a chest cold several weeks ago and this shortness of breath has lingered.  Tightness with inspiration .   Feels like his asthma  Has had 2 steroid dose packs which helped a little  Still able to go to the gym without any chest pain or dyspnea        Current Outpatient Medications on File Prior to Visit  Medication Sig Dispense Refill  . albuterol (PROVENTIL) (2.5 MG/3ML) 0.083% nebulizer solution Take 3 mLs (2.5 mg total) by nebulization every 6 (six) hours as needed for wheezing or shortness of breath. 75 mL 2  . B Complex Vitamins (VITAMIN-B COMPLEX) TABS Take by mouth as directed.     . budesonide-formoterol (SYMBICORT) 160-4.5 MCG/ACT inhaler Inhale 2 puffs into the lungs 2 (two) times daily. 1 Inhaler 6  . Eszopiclone (ESZOPICLONE) 3 MG TABS Take 3 mg by mouth at bedtime. Take immediately before bedtime     . famotidine (PEPCID) 10 MG tablet  Take 10 mg by mouth at bedtime as needed. For heartburn    . losartan (COZAAR) 100 MG tablet Take 1 tablet (100 mg total) by mouth daily. 90 tablet 3  . MAGNESIUM PO Take 1 tablet by mouth daily.    . Multiple Vitamin (MULTI-VITAMINS) TABS Take 1 tablet by mouth daily.    . Omega-3 1000 MG CAPS Take 1 capsule by mouth daily.     . ranitidine (ZANTAC) 150 MG tablet Take 150 mg by mouth 2 (two) times daily.    Marland Kitchen. tetrahydrozoline 0.05 % ophthalmic solution Apply 1 drop to eye as needed (Use as directed for allergies).      No current facility-administered medications on file prior to visit.     Allergies  Allergen Reactions  . Meperidine Nausea And Vomiting  . No Known Allergies     Has some intolerances  . Anesthetics, Amide Nausea Only    Surgical anesthetic    Past Medical History:  Diagnosis Date  . Arthritis    "all over"  . Asthma   . GERD (gastroesophageal reflux disease)   . Hiatal hernia   . Hypertension   . IBS (irritable bowel syndrome)   . PONV  (postoperative nausea and vomiting)   . Postoperative ileus (HCC) 05/22/2014   S/P back OR 05/19/2014    Past Surgical History:  Procedure Laterality Date  . ACHILLES TENDON REPAIR Left 1990's  . BACK SURGERY    . ELBOW SURGERY Bilateral 2000's   "scoped them and took out bone chips"  . EYE SURGERY Left 1980's   "removed splinters"  . POSTERIOR FUSION LUMBAR SPINE, MINIMALLY INVASIVE  04/20/2014  . ROTATOR CUFF REPAIR Left 2002  . SHOULDER ARTHROSCOPY W/ ROTATOR CUFF REPAIR Right 2011  . SHOULDER OPEN ROTATOR CUFF REPAIR Left 1990's; 2000's  . TONSILLECTOMY  1954    Social History   Tobacco Use  Smoking Status Passive Smoke Exposure - Never Smoker  Smokeless Tobacco Never Used  Tobacco Comment   Father & Mother when he was a child.     Social History   Substance and Sexual Activity  Alcohol Use Yes   Comment: 05/22/2014 "a drink q 2 wk or so"    Family History  Problem Relation Age of Onset  . Heart disease Mother   . Stroke Mother   . Brain cancer Mother   . Heart disease Father   . Alzheimer's disease Brother   . Asthma Brother   . Asthma Daughter     Reviw of Systems:  Reviewed in the HPI.  All other systems are negative.  Physical Exam: Blood pressure 126/76, pulse 73, height 5' 10.5" (1.791 m), weight 174 lb 9.6 oz (79.2 kg), SpO2 98 %.  GEN:  Well nourished, well developed in no acute distress HEENT: Normal NECK: No JVD; No carotid bruits LYMPHATICS: No lymphadenopathy CARDIAC: RRR ,  Occasional premature beats.  RESPIRATORY:  Clear to auscultation without rales, wheezing or rhonchi  ABDOMEN: Soft, non-tender, non-distended MUSCULOSKELETAL:  No edema; No deformity  SKIN: Warm and dry NEUROLOGIC:  Alert and oriented x 3   ECG:    Assessment / Plan:   1.  coronary calcifications: Ronnie Hardy presents today with recent diagnosis of coronary calcifications primarily in the left anterior descending artery. His  Score is 390.  He is not having any angina.   He remains on Crestor 5 mg about 2-3 times a week.  I encouraged him to try to remember to take it at  least 3 times a week.  His symptoms do not sound like coronary artery disease.   Continue to follow.  I will see him again in a year.  2.  Shortness of breath: He is having increased shortness of breath for the past several weeks.  I suspect that this is more of a pulmonary problem.  Is been exceptionally hot and humid.  He is been to the pulmonologist multiple times.  His spirometry is normal.  He is on multiple asthma medications. We will get an echocardiogram for further evaluation of his valvular function and left ventricular function. We will have him continue to follow-up with pulmonary     2. Hypertension:  BP is well controlled.    Kristeen MissPhilip Nahser, MD  10/24/2017 1:52 PM    [facility] [address],  [address] [place], [place]  [address] Pager [phone] Phone: [phone]; Fax: [phone]

## 2017-10-24 NOTE — Patient Instructions (Signed)
Medication Instructions:  Your physician has recommended you make the following change in your medication:   START Rosuvastatin (Crestor) 5 mg 3 times per week   Labwork: None Ordered   Testing/Procedures: Your physician has requested that you have an echocardiogram. Echocardiography is a painless test that uses sound waves to create images of your heart. It provides your doctor with information about the size and shape of your heart and how well your heart's chambers and valves are working. This procedure takes approximately one hour. There are no restrictions for this procedure.   Follow-Up: Your physician wants you to follow-up in: 1 year with Dr. Elease HashimotoNahser. You will receive a reminder letter in the mail two months in advance. If you don't receive a letter, please call our office to schedule the follow-up appointment.   If you need a refill on your cardiac medications before your next appointment, please call your pharmacy.   Thank you for choosing [facility]! Eligha BridegroomMichelle Afton Lavalle, RN [phone]

## 2017-10-30 ENCOUNTER — Other Ambulatory Visit: Payer: Self-pay

## 2017-10-30 ENCOUNTER — Ambulatory Visit (HOSPITAL_COMMUNITY): Payer: PRIVATE HEALTH INSURANCE | Attending: Cardiovascular Disease

## 2017-10-30 DIAGNOSIS — R0602 Shortness of breath: Secondary | ICD-10-CM | POA: Insufficient documentation

## 2017-10-30 DIAGNOSIS — I2510 Atherosclerotic heart disease of native coronary artery without angina pectoris: Secondary | ICD-10-CM

## 2017-10-30 DIAGNOSIS — R0600 Dyspnea, unspecified: Secondary | ICD-10-CM | POA: Diagnosis not present

## 2017-10-30 DIAGNOSIS — I340 Nonrheumatic mitral (valve) insufficiency: Secondary | ICD-10-CM | POA: Diagnosis not present

## 2017-10-30 DIAGNOSIS — I10 Essential (primary) hypertension: Secondary | ICD-10-CM | POA: Diagnosis not present

## 2017-11-02 ENCOUNTER — Ambulatory Visit: Payer: PRIVATE HEALTH INSURANCE | Admitting: Pulmonary Disease

## 2017-11-03 ENCOUNTER — Telehealth: Payer: Self-pay | Admitting: Cardiovascular Disease

## 2017-11-03 NOTE — Telephone Encounter (Signed)
° °  Please call: Result Notes for ECHOCARDIOGRAM COMPLETE   Notes recorded by Levi AlandSwinyer, Michelle M, RN on 11/03/2017 at 9:34 AM EDT Left message for patient to call office for results

## 2017-11-03 NOTE — Telephone Encounter (Signed)
Echo results reviewed with patient who verbalized understanding and I have explained the reason for discrepancy in report in the event that MyChart shows initial report. The patient thanked me for the call.

## 2018-02-23 IMAGING — CT CT CHEST W/O CM
2 of 3 series · 15 of 36 positions shown, 18 images · non-contrast
Comparison: CT scan of July 01, 2016.

CLINICAL DATA: Pulmonary nodule.  Shortness of breath.

EXAM:
CT CHEST WITHOUT CONTRAST
TECHNIQUE: Multidetector CT imaging of the chest was performed following the
standard protocol without IV contrast.

[Series 2: thorax · axial · 0.79mm/px · z∈[-369,-73]mm · 12 of 174 slices shown, 15 images]
[im 13/174  mediastinal]
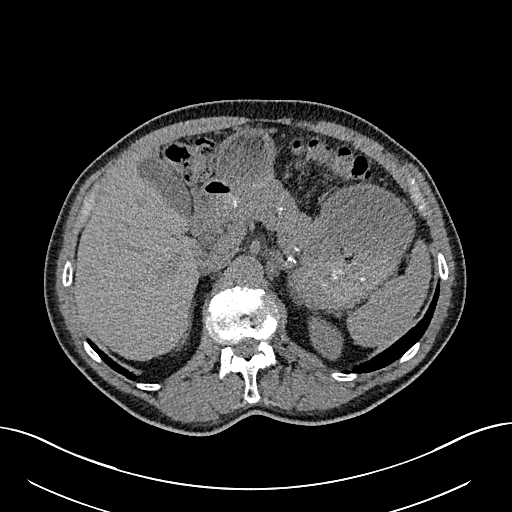
[im 13/174  lung]
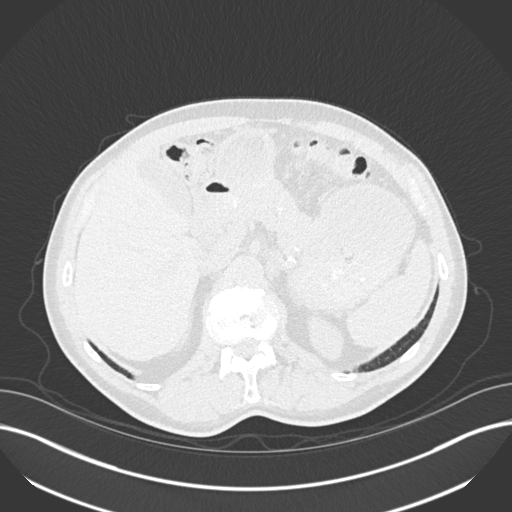
[im 26/174  lung]
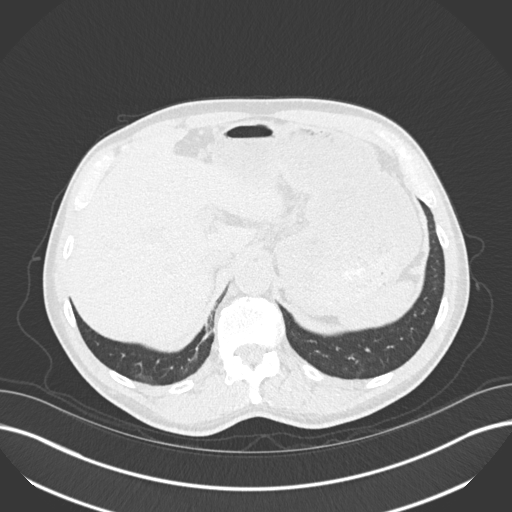
[im 39/174  lung]
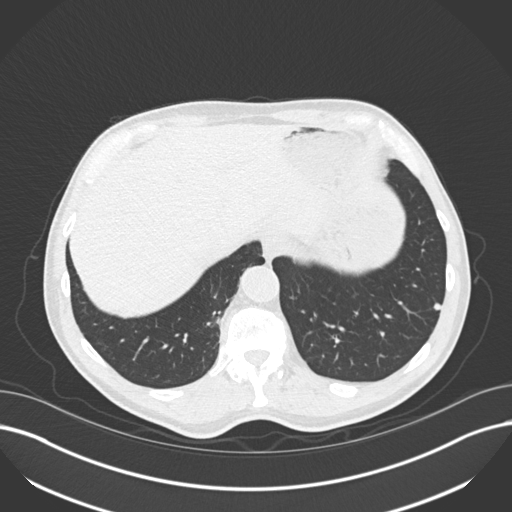
[im 52/174  lung]
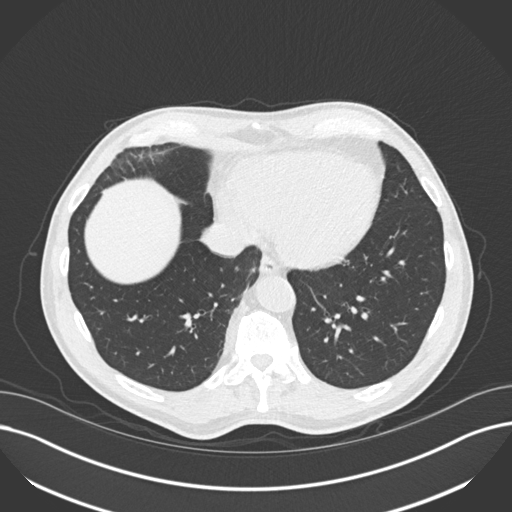
[im 65/174  mediastinal]
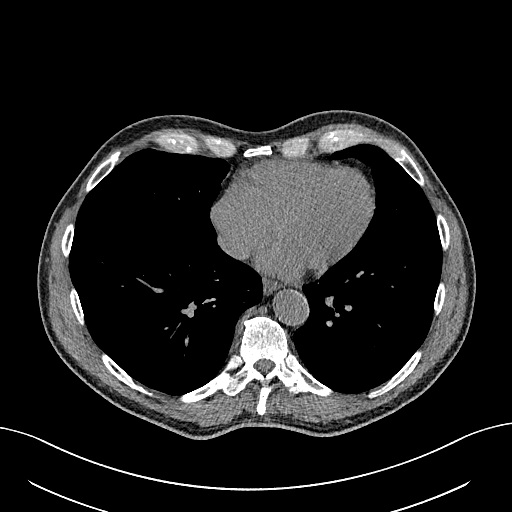
[im 65/174  lung]
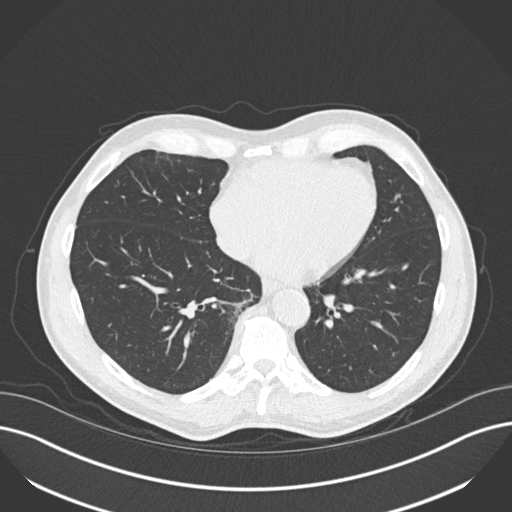
[im 77/174  lung]
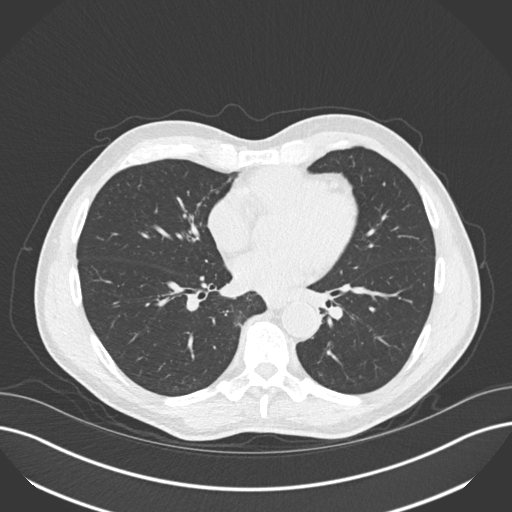
[im 97/174  lung]
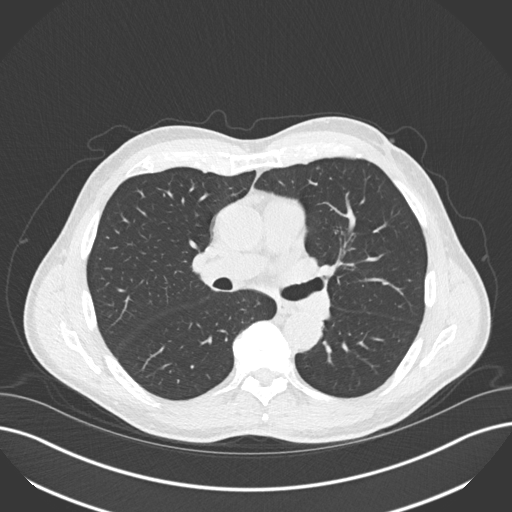
[im 109/174  lung]
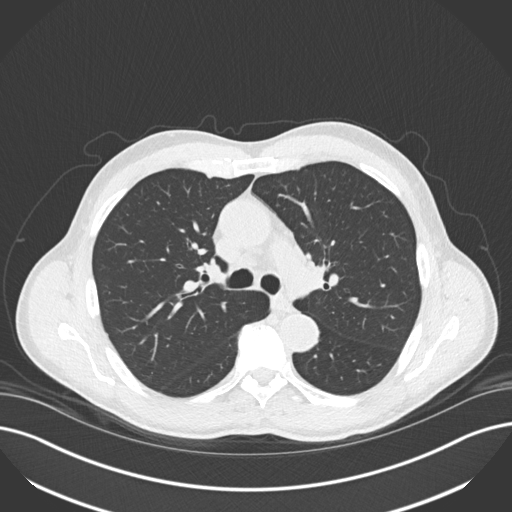
[im 122/174  mediastinal]
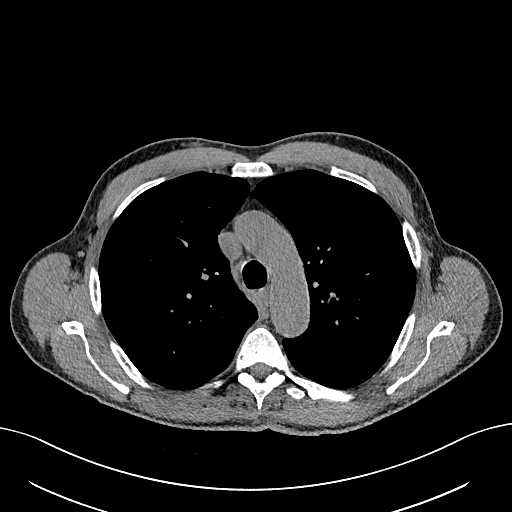
[im 122/174  lung]
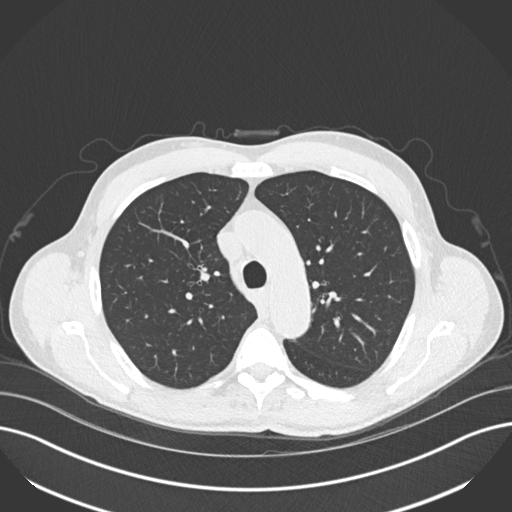
[im 135/174  lung]
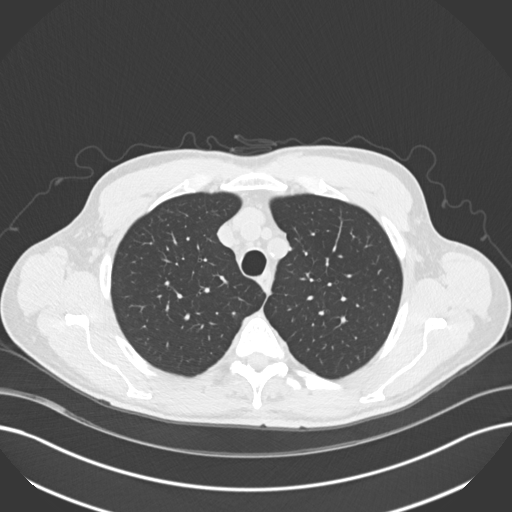
[im 148/174  lung]
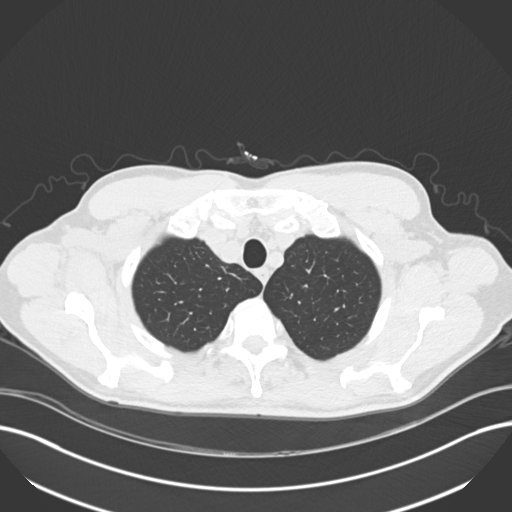
[im 161/174  lung]
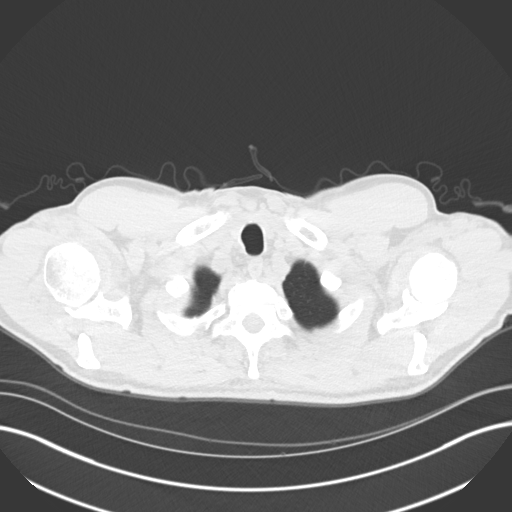

[Series 5: coronal · coronal · 0.68mm/px · 3 of 122 slices shown]
[im 25/122  lung]
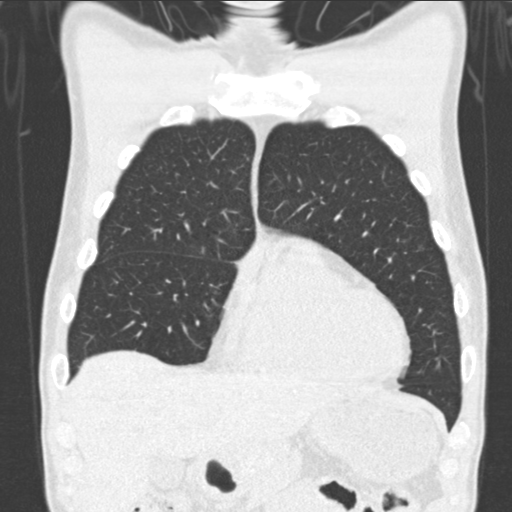
[im 49/122  lung]
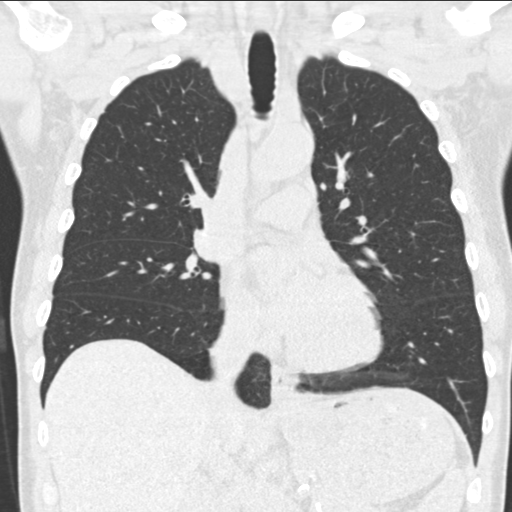
[im 73/122  lung]
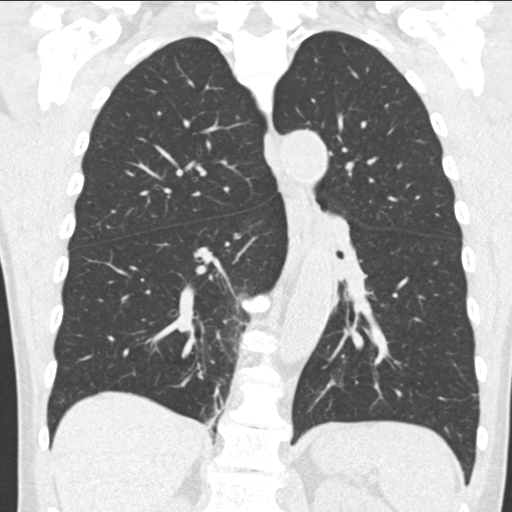

[15 of 36 positions shown; findings below may reference images not displayed]

FINDINGS: Cardiovascular: Atherosclerosis of thoracic aorta is noted without
aneurysm formation. Coronary artery calcifications are noted. Normal
cardiac size. No pericardial effusion.

Mediastinum/Nodes: No enlarged mediastinal or axillary lymph nodes.
Thyroid gland, trachea, and esophagus demonstrate no significant
findings.

Lungs/Pleura: No pneumothorax or pleural effusion is noted. 5 mm
subpleural nodule is seen laterally in left lower lobe best seen on
image number 136 of series 3. 3 mm nodule is noted posteriorly in
the left upper lobe best seen in image number 33 of series 3.
Probable scarring is seen in right lung base. 4 mm nodule is noted
anteriorly and inferior portion of right middle lobe best seen on
image number 117 of series 3. 6 mm subpleural nodule is noted
anteriorly in the right middle lobe best seen on image number 105 of
series 3.

Upper Abdomen: No acute abnormality.

Musculoskeletal: No chest wall mass or suspicious bone lesions
identified.
IMPRESSION: Aortic atherosclerosis.

Coronary artery calcifications are noted suggesting coronary artery
disease.

Bilateral pulmonary nodules are noted, with the largest measuring 6
mm in right middle lobe. Non-contrast chest CT at 3-6 months is
recommended. If the nodules are stable at time of repeat CT, then
future CT at 18-24 months (from today's scan) is considered optional
for low-risk patients, but is recommended for high-risk patients.
This recommendation follows the consensus statement: Guidelines for
Management of Incidental Pulmonary Nodules Detected on CT Images:

## 2018-03-30 ENCOUNTER — Other Ambulatory Visit: Payer: Self-pay | Admitting: Orthopedic Surgery

## 2018-03-30 DIAGNOSIS — M1712 Unilateral primary osteoarthritis, left knee: Secondary | ICD-10-CM

## 2018-04-03 ENCOUNTER — Ambulatory Visit
Admission: RE | Admit: 2018-04-03 | Discharge: 2018-04-03 | Disposition: A | Discharge status: Home / Self Care | Payer: PRIVATE HEALTH INSURANCE | Source: Ambulatory Visit | Attending: Orthopedic Surgery | Admitting: Orthopedic Surgery

## 2018-04-03 DIAGNOSIS — M1712 Unilateral primary osteoarthritis, left knee: Secondary | ICD-10-CM

## 2018-09-06 ENCOUNTER — Other Ambulatory Visit: Payer: Self-pay

## 2018-09-06 ENCOUNTER — Ambulatory Visit (INDEPENDENT_AMBULATORY_CARE_PROVIDER_SITE_OTHER)
Admission: RE | Admit: 2018-09-06 | Discharge: 2018-09-06 | Disposition: A | Discharge status: Home / Self Care | Payer: 59 | Source: Ambulatory Visit | Attending: Acute Care | Admitting: Acute Care

## 2018-09-06 DIAGNOSIS — R911 Solitary pulmonary nodule: Secondary | ICD-10-CM | POA: Diagnosis not present

## 2018-09-11 ENCOUNTER — Telehealth: Payer: Self-pay

## 2018-09-11 ENCOUNTER — Telehealth: Payer: Self-pay | Admitting: Acute Care

## 2018-09-11 NOTE — Telephone Encounter (Signed)
Per NP SG please schedule OV to discuss Chest CT.   Call made to patient, made aware he will need an OV to discuss Chest CT results. Voiced understanding. Appt made for 09/12/2018 at 1:30pm. Nothing further needed at this time.

## 2018-09-11 NOTE — Telephone Encounter (Signed)
Please schedule for 3 pm tele visit on my schedule tomorrow 09/12/2018 to review CT results. Thanks so much.

## 2018-09-12 ENCOUNTER — Encounter: Payer: Self-pay | Admitting: Acute Care

## 2018-09-12 ENCOUNTER — Ambulatory Visit (INDEPENDENT_AMBULATORY_CARE_PROVIDER_SITE_OTHER): Payer: 59 | Admitting: Acute Care

## 2018-09-12 ENCOUNTER — Other Ambulatory Visit: Payer: Self-pay

## 2018-09-12 DIAGNOSIS — R911 Solitary pulmonary nodule: Secondary | ICD-10-CM | POA: Diagnosis not present

## 2018-09-12 DIAGNOSIS — J45909 Unspecified asthma, uncomplicated: Secondary | ICD-10-CM

## 2018-09-12 MED ORDER — WIXELA INHUB 250-50 MCG/DOSE IN AEPB: INHALATION_SPRAY | RESPIRATORY_TRACT | 5 refills | Status: DC

## 2018-09-12 NOTE — Telephone Encounter (Signed)
Thanks so much. 

## 2018-09-12 NOTE — Patient Instructions (Addendum)
It was good to talk with you today. Follow up CT Chest without contrast in 6 months ( 03/2019) Follow up after CT chest with Dr. Ander Slade. Continue using Advair as you have been doing We will send in a refill for your Advair Rinse mouth after use Follow up 03/2019 with Dr. Ander Slade after CT chest Please contact office for sooner follow up if symptoms do not improve or worsen or seek emergency care    Call us if you have any change in your breathing, or have any blood in your secretions.

## 2018-09-12 NOTE — Progress Notes (Signed)
Virtual Visit via Video Note  I connected with Ronnie Hardy on 09/12/18 at  1:30 PM EDT by a video enabled telemedicine application and verified that I am speaking with the correct person using two identifiers.  I  confirmed date of birth and address to authenticate patient  Identity. My nurse Quentin Ore reviewed medications and ordered any refills required.  Synopsis Ronnie Hardy is a 73 y.o. male with asthma , chronic allergic rhinitis and pulmonary nodules. He was previously followed by Dr. Ashok Cordia. Now followed by Dr. Ander Slade.   Location: Patient: At home Provider: In the office at [address] [place], [place] , [address]   I discussed the limitations of evaluation and management by telemedicine and the availability of in person appointments. The patient expressed understanding and agreed to proceed.  History of Present Illness: Pt. Presents for a tele visit to discuss the results of his CT chest. He states he is doing well     Observations/Objective: 09/06/2018 CT Chest w/o Contrast Interval development of a new 7 mm subpleural nodule in the right middle lobe. Non-contrast chest CT at 6-12 months is recommended. If the nodule is stable at time of repeat CT, then future CT at 18-24 months (from today's scan) is considered optional for low-risk patients, but is recommended for high-risk patients. This recommendation follows the consensus statement: Guidelines for Management of Incidental Pulmonary Nodules Detected on CT Images: From the Fleischner Society 2017; Radiology 2017; 284:228-243. Remaining scattered tiny bilateral pulmonary nodules are stable in the 1 year interval since prior study. Aortic Atherosclerois (ICD10-170.0)  CT Chest w/o contrast 07/05/2017 Stable pulmonary nodules compared with 07/29/2016. The largest measuring 6 mm. If the nodules are stable at time of repeat CT, then future CT at 18-24 months (from 07/29/2016) is considered  optional for low-risk patients, but is recommended for high-risk patients. Aortic Atherosclerosis (ICD10-I70.0). Lad coronary artery calcifications noted   CARDIAC CT 07/01/16 :Limited lung windows reviewed. No pleural thickening or effusion appreciated. Patient did have a 5 mm at least partially solid and 4 mm groundglass nodule within the anterior segment of the right lower lobe. No other parenchymal opacity appreciated. Subcentimeter subcarinal lymphadenopathy. No pericardial effusion appreciated.    CT CHEST W/O 07/29/16  2 mm left upper lobe, 6 mm right middle lobe, & 5 mm left lower lobe nodules noted. No developing nodule or mass appreciated. No pleural effusion or thickening. No pericardial effusion. No pathologic mediastinal adenopathy.   LABS 07/21/16 CBC:6.3/14.1/41.9/202 Eosinophils: 0.3 IgE: <2 RAST Panel: Negative   Assessment and Plan: New 7 mm Sub Pleural Pulmonary Nodule Never smoker with passive smoke exposure as a child ( Both patents smoked) Plan Follow up CT Chest without contrast in 6 months ( 03/2019) Follow up after CT chest with Dr. Ander Slade.  Asthma Stable interval Allergy profile done by PCP was + for [place] grass and tree pollen.  Plan Continue using Advair as you have been doing We will send in a refill for your Advair Rinse mouth after use Follow up 03/2019 with Dr. Ander Slade Please contact office for sooner follow up if symptoms do not improve or worsen or seek emergency care   Follow Up Instructions: Follow up in 6 months after repeat CT chest without contrast 03/2019 with Dr. Ander Slade. We will call you with an appointment closer to the date.   I discussed the assessment and treatment plan with the patient. The patient was provided an opportunity to ask questions and all were  answered. The patient agreed with the plan and demonstrated an understanding of the instructions.   The patient was advised to call back or seek an in-person  evaluation if the symptoms worsen or if the condition fails to improve as anticipated.  I provided 23  minutes of non-face-to-face time during this encounter.   Bevelyn NgoSarah F Demetreus Lothamer, NP 09/12/2018 1:56 PM

## 2018-09-19 ENCOUNTER — Telehealth: Payer: Self-pay | Admitting: Acute Care

## 2018-09-19 MED ORDER — FLUTICASONE-SALMETEROL 250-50 MCG/DOSE IN AEPB: 1.0000 | INHALATION_SPRAY | Freq: Two times a day (BID) | RESPIRATORY_TRACT | 5 refills | Status: DC

## 2018-09-19 NOTE — Telephone Encounter (Signed)
She's here this afternoon from what I can tell ???

## 2018-09-19 NOTE — Telephone Encounter (Signed)
It is fine to substitute generic Advair. 2 puffs twice daily, rinse after use.

## 2018-09-19 NOTE — Telephone Encounter (Signed)
I have discontinued the wixela inhaler and have sent Rx to pharmacy for generic advair. Nothing further needed.

## 2018-09-19 NOTE — Telephone Encounter (Signed)
Instructions from OV with SG 09/12/2018    Return in about 6 months (around 03/15/2019), or if symptoms worsen or fail to improve. It was good to talk with you today. Follow up CT Chest without contrast in 6 months ( 03/2019) Follow up after CT chest with Dr. Ander Slade. Continue using Advair as you have been doing We will send in a refill for your Advair Rinse mouth after use Follow up 03/2019 with Dr. Ander Slade after CT chest Please contact office for sooner follow up if symptoms do not improve or worsen or seek emergency care    Call us if you have any change in your breathing, or have any blood in your secretions.      Called pt's pharmacy and spoke with Nira Conn to see how much difference in cost it would be for pt to switch from [facility] to [place]. Per Sharene Butters would be $398 and the Advair is $378.18 $20 cheaper if pt switched to the Generic Advair.  Due to SG being gone for the afternoon, sending to APP of day. Beth, please advise if you will be fine with [place] switching pt from [facility] to Advair and if we should use same dose as Wixela for the generic Advair. Thanks!

## 2018-09-19 NOTE — Telephone Encounter (Signed)
Yes Ronnie Hardy is still in clinic this afternoon. I looked at the wrong day. Routing to Ronnie Hardy for her to advise on this for pt. Sarah, please advise. Thanks!

## 2018-10-09 MED ORDER — FLUTICASONE-SALMETEROL 250-50 MCG/DOSE IN AEPB: 1.0000 | INHALATION_SPRAY | Freq: Two times a day (BID) | RESPIRATORY_TRACT | 5 refills | Status: DC

## 2018-12-07 ENCOUNTER — Telehealth: Payer: Self-pay | Admitting: Acute Care

## 2018-12-07 NOTE — Telephone Encounter (Signed)
Called pt's insurance and was told that a precert was required for this chest ct -was given infor and fax# to fax records for a retro precert- spoke to pt he is aware that once I hear back from them I will call him with results Ronnie Hardy

## 2018-12-07 NOTE — Telephone Encounter (Signed)
Spoke to pt and I will check to see what has happened Ronnie Hardy

## 2018-12-16 NOTE — Progress Notes (Signed)
Ronnie LarryMichael Jay Hardy Date of Birth  02/03/1946       [facility]    [place] [facility] [address]  [address], [address] [place], [place]  [id number]   [place], [place]  [id number] [phone]     [phone]   Fax  [phone]    Fax [phone]  Problem List: 1. Hypertension 2. Asthma 3. Coronary artery calcifications   01/07/2013 Ronnie Hardy is a 73 yo with hx of HTN.  Avoids salty or fatty foods.  Works out daily .    He has had HTN for the past 5 years or so.   He has a family hx of heart disease ( mother died of stroke, father died of MI - smoker)   He has throbbing in his throat and then knows that his BP is elevated.   He has not been diagnosed with sleep apnea but admits that he doesn't sleep well.    Feb. 4, 2014: BP has been 130s / 80s.  He was started on Horizant but it seems to be causing some peripheral edema.   Ronnie Hardy is seen back for evaluation of coronary artery calcifications . In March he developed a bid chest cold.   Was still short of breath.   CXR showed some calcifications on the aorta ( and ? Coronary )    A coronary calcium score was 390 which is in the 68% of matched subjects   Was started on a statin and ASA by Dr. Renne CriglerPharr.   Works out 5 times a week. No angina with work outs .   Has some  He goes mountain biking on occasion. Has some dyspnea recently which seems to resolve as he continues to cycle.  The tightness seems to be worse in the afternoon and evening.    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  Had a heart cath by Dr. Lorenda HatchetGupta aound 2003.  Cath was normal.     August 24, 2016   Ronnie Hardy is seen today  Had a myoview on Jul 14, 2016 that showed no ischemia and normal LV function Cornaary calcium score was 390 .  No recent chest pain .  No dyspnea   Aug. 20, 2019:  Ronnie Hardy is seen today for eval of his contiued shortness of breath .  Has a hx of asthma. Saw pulmonary last week  Spirometry was normal in the  office Denies any chest pain  Has had dyspnea for 7-8 weeks,   Had a chest cold several weeks ago and this shortness of breath has lingered.  Tightness with inspiration .   Feels like his asthma  Has had 2 steroid dose packs which helped a little  Still able to go to the gym without any chest pain or dyspnea     Oct. 12, 2020   Ronnie Hardy is seen today for follow up visit for his coronary artery calcifications and hyperlipidemia He has had a negative myoview in 2018.  He is back at work now. Has developed a LBBB since I I last saw him   Is working out. Feels well, no CP or dyspnea.     Current Outpatient Medications on File Prior to Visit  Medication Sig Dispense Refill  . albuterol (PROVENTIL) (2.5 MG/3ML) 0.083% nebulizer solution Take 3 mLs (2.5 mg total) by nebulization every 6 (six) hours as needed for wheezing or shortness of breath. 75 mL 2  . B Complex Vitamins (VITAMIN-B COMPLEX)  TABS Take by mouth as directed.     . budesonide-formoterol (SYMBICORT) 160-4.5 MCG/ACT inhaler Inhale 2 puffs into the lungs 2 (two) times daily. 1 Inhaler 6  . Eszopiclone (ESZOPICLONE) 3 MG TABS Take 3 mg by mouth at bedtime. Take immediately before bedtime     . famotidine (PEPCID) 10 MG tablet Take 10 mg by mouth at bedtime as needed. For heartburn    . Fluticasone-Salmeterol (ADVAIR DISKUS) 250-50 MCG/DOSE AEPB Inhale 1 puff into the lungs 2 (two) times daily. 60 each 5  . losartan (COZAAR) 100 MG tablet Take 1 tablet (100 mg total) by mouth daily. 90 tablet 3  . MAGNESIUM PO Take 1 tablet by mouth daily.    . Multiple Vitamin (MULTI-VITAMINS) TABS Take 1 tablet by mouth daily.    . Omega-3 1000 MG CAPS Take 1 capsule by mouth daily.     . ranitidine (ZANTAC) 150 MG tablet Take 150 mg by mouth 2 (two) times daily.    . rosuvastatin (CRESTOR) 5 MG tablet Take 1 tablet (5 mg total) by mouth 3 (three) times a week. 45 tablet 3  . tetrahydrozoline 0.05 % ophthalmic solution Apply 1 drop to eye as needed  (Use as directed for allergies).      No current facility-administered medications on file prior to visit.     Allergies  Allergen Reactions  . Meperidine Nausea And Vomiting  . No Known Allergies     Has some intolerances  . Anesthetics, Amide Nausea Only    Surgical anesthetic    Past Medical History:  Diagnosis Date  . Arthritis    "all over"  . Asthma   . GERD (gastroesophageal reflux disease)   . Hiatal hernia   . Hypertension   . IBS (irritable bowel syndrome)   . PONV (postoperative nausea and vomiting)   . Postoperative ileus ([place]) 05/22/2014   S/P back OR 05/19/2014    Past Surgical History:  Procedure Laterality Date  . ACHILLES TENDON REPAIR Left 1990's  . BACK SURGERY    . ELBOW SURGERY Bilateral 2000's   "scoped them and took out bone chips"  . EYE SURGERY Left 1980's   "removed splinters"  . POSTERIOR FUSION LUMBAR SPINE, MINIMALLY INVASIVE  04/20/2014  . ROTATOR CUFF REPAIR Left 2002  . SHOULDER ARTHROSCOPY W/ ROTATOR CUFF REPAIR Right 2011  . SHOULDER OPEN ROTATOR CUFF REPAIR Left 1990's; 2000's  . TONSILLECTOMY  1954    Social History   Tobacco Use  Smoking Status Passive Smoke Exposure - Never Smoker  Smokeless Tobacco Never Used  Tobacco Comment   Father & Mother when he was a child.     Social History   Substance and Sexual Activity  Alcohol Use Yes   Comment: 05/22/2014 "a drink q 2 wk or so"    Family History  Problem Relation Age of Onset  . Heart disease Mother   . Stroke Mother   . Brain cancer Mother   . Heart disease Father   . Alzheimer's disease Brother   . Asthma Brother   . Asthma Daughter     Reviw of Systems:  Reviewed in the HPI.  All other systems are negative.  Physical Exam: There were no vitals taken for this visit.  GEN:  Well nourished, well developed in no acute distress HEENT: Normal NECK: No JVD; No carotid bruits LYMPHATICS: No lymphadenopathy CARDIAC: RRR , soft systolic murmur in left axilla .   RESPIRATORY:  Clear to auscultation without rales, wheezing  or rhonchi  ABDOMEN: Soft, non-tender, non-distended MUSCULOSKELETAL:  No edema; No deformity  SKIN: Warm and dry NEUROLOGIC:  Alert and oriented x 3   ECG: December 17, 2018: Normal sinus rhythm with first-degree AV block.  Heart rate is 63.  Left bundle branch block with right axis deviation.    Assessment / Plan:   1.  coronary calcifications::   Coronary  Calcium is 390   2.  LBBB :  Has a new LBBB .    Had a cardiac cath 15 years ago that showed normal coronarites.   Echo several years ago looked good Has known coronary art. Calcifications Will repeat an echo to look for dyssynchrony.   Will get coronary art. CT angio.to evaluate for progressive CAD given his coronary artery carcificatons.    Will see him in 2 months    2. Hypertension: BP is well controlled.    Kristeen Miss, MD  12/16/2018 9:00 PM    [facility] [address] [place],  [address] [place], [place]  [address] Pager [phone] Phone: [phone]; Fax: [phone]

## 2018-12-17 ENCOUNTER — Ambulatory Visit (INDEPENDENT_AMBULATORY_CARE_PROVIDER_SITE_OTHER): Payer: 59 | Admitting: Cardiovascular Disease

## 2018-12-17 ENCOUNTER — Encounter: Payer: Self-pay | Admitting: Cardiovascular Disease

## 2018-12-17 ENCOUNTER — Other Ambulatory Visit: Payer: Self-pay

## 2018-12-17 VITALS — BP 120/72 | HR 63 | Ht 70.5 in | Wt 167.8 lb

## 2018-12-17 DIAGNOSIS — I2510 Atherosclerotic heart disease of native coronary artery without angina pectoris: Secondary | ICD-10-CM

## 2018-12-17 DIAGNOSIS — Z9189 Other specified personal risk factors, not elsewhere classified: Secondary | ICD-10-CM | POA: Diagnosis not present

## 2018-12-17 DIAGNOSIS — I447 Left bundle-branch block, unspecified: Secondary | ICD-10-CM | POA: Insufficient documentation

## 2018-12-17 DIAGNOSIS — R9431 Abnormal electrocardiogram [ECG] [EKG]: Secondary | ICD-10-CM

## 2018-12-17 DIAGNOSIS — I2584 Coronary atherosclerosis due to calcified coronary lesion: Secondary | ICD-10-CM | POA: Diagnosis not present

## 2018-12-17 MED ORDER — METOPROLOL TARTRATE 100 MG PO TABS: ORAL_TABLET | ORAL | 0 refills | Status: DC

## 2018-12-17 NOTE — Patient Instructions (Addendum)
Medication Instructions:  Your physician recommends that you continue on your current medications as directed. Please refer to the Current Medication list given to you today.  If you need a refill on your cardiac medications before your next appointment, please call your pharmacy.   Lab work: Your physician recommends that you return for lab work in: 1 week before your CT for basic metabolic profile   Testing/Procedures: Your physician has requested that you have an echocardiogram. Echocardiography is a painless test that uses sound waves to create images of your heart. It provides your doctor with information about the size and shape of your heart and how well your heart's chambers and valves are working. This procedure takes approximately one hour. There are no restrictions for this procedure.  Your cardiac CT will be scheduled at one of the below locations:   [facility] [address] [place], [place] [address] [phone]  OR  [facility] [address] [place], [place] [address] [phone]  If scheduled at [facility], please arrive at the [facility] main entrance of [facility] 30-45 minutes prior to test start time. Proceed to the [facility] Radiology Department (first floor) to check-in and test prep.  If scheduled at [facility], please arrive 15 mins early for check-in and test prep.  Please follow these instructions carefully (unless otherwise directed):  Hold all erectile dysfunction medications at least 3 days (72 hrs) prior to test.  On the Night Before the Test: . Be sure to Drink plenty of water. . Do not consume any caffeinated/decaffeinated beverages or chocolate 12 hours prior to your test. . Do not take any antihistamines 12 hours prior to your test.   On the Day of the Test: . Drink plenty of water. Do not drink any water within one hour of the  test. . Do not eat any food 4 hours prior to the test. . You may take your regular medications prior to the test.  . Take metoprolol (Lopressor) two hours prior to test.      After the Test: . Drink plenty of water. . After receiving IV contrast, you may experience a mild flushed feeling. This is normal. . On occasion, you may experience a mild rash up to 24 hours after the test. This is not dangerous. If this occurs, you can take Benadryl 25 mg and increase your fluid intake. . If you experience trouble breathing, this can be serious. If it is severe call 911 IMMEDIATELY. If it is mild, please call our office. . If you take any of these medications: Glipizide/Metformin, Avandament, Glucavance, please do not take 48 hours after completing test unless otherwise instructed.    Please contact the cardiac imaging nurse navigator should you have any questions/concerns Rockwell Alexandria, RN Navigator Cardiac Imaging [facility] [phone] Office  [phone] Cell    Follow-Up: At [facility], you and your health needs are our priority.  As part of our continuing mission to provide you with exceptional heart care, we have created designated Provider Care Teams.  These Care Teams include your primary Cardiologist (physician) and Advanced Practice Providers (APPs -  Physician Assistants and Nurse Practitioners) who all work together to provide you with the care you need, when you need it. You will need a follow up appointment in:  2 months.  You may see Kristeen Miss, MD or one of the following Advanced Practice Providers on  your designated Care Team: Richardson Dopp, [place]

## 2018-12-27 ENCOUNTER — Ambulatory Visit (HOSPITAL_COMMUNITY): Payer: 59

## 2018-12-27 ENCOUNTER — Other Ambulatory Visit: Payer: Self-pay

## 2019-01-02 ENCOUNTER — Ambulatory Visit (HOSPITAL_COMMUNITY): Payer: 59 | Attending: Cardiovascular Disease

## 2019-01-02 ENCOUNTER — Other Ambulatory Visit: Payer: Self-pay

## 2019-01-02 DIAGNOSIS — I2584 Coronary atherosclerosis due to calcified coronary lesion: Secondary | ICD-10-CM | POA: Insufficient documentation

## 2019-01-02 DIAGNOSIS — I2510 Atherosclerotic heart disease of native coronary artery without angina pectoris: Secondary | ICD-10-CM | POA: Diagnosis present

## 2019-01-02 DIAGNOSIS — I447 Left bundle-branch block, unspecified: Secondary | ICD-10-CM

## 2019-01-25 ENCOUNTER — Other Ambulatory Visit: Payer: Self-pay | Admitting: Nurse Practitioner

## 2019-02-05 ENCOUNTER — Other Ambulatory Visit: Payer: Self-pay

## 2019-02-05 ENCOUNTER — Other Ambulatory Visit: Payer: 59

## 2019-02-05 DIAGNOSIS — I2510 Atherosclerotic heart disease of native coronary artery without angina pectoris: Secondary | ICD-10-CM

## 2019-02-05 DIAGNOSIS — I447 Left bundle-branch block, unspecified: Secondary | ICD-10-CM

## 2019-02-05 DIAGNOSIS — I2584 Coronary atherosclerosis due to calcified coronary lesion: Secondary | ICD-10-CM

## 2019-02-06 LAB — BASIC METABOLIC PANEL
BUN/Creatinine Ratio: 15 (ref 10–24)
BUN: 12 mg/dL (ref 8–27)
CO2: 26 mmol/L (ref 20–29)
Calcium: 9.3 mg/dL (ref 8.6–10.2)
Chloride: 97 mmol/L (ref 96–106)
Creatinine, Ser: 0.82 mg/dL (ref 0.76–1.27)
GFR calc Af Amer: 101 mL/min/{1.73_m2} (ref >59)
GFR calc non Af Amer: 88 mL/min/{1.73_m2} (ref >59)
Glucose: 83 mg/dL (ref 65–99)
Potassium: 5 mmol/L (ref 3.5–5.2)
Sodium: 133 mmol/L — ABNORMAL LOW (ref 134–144)

## 2019-02-12 ENCOUNTER — Telehealth (HOSPITAL_COMMUNITY): Payer: Self-pay | Admitting: Emergency Medicine

## 2019-02-12 ENCOUNTER — Encounter (HOSPITAL_COMMUNITY): Payer: Self-pay

## 2019-02-12 NOTE — Telephone Encounter (Signed)
Left message on voicemail with name and callback number Duchess Armendarez RN Navigator Cardiac Imaging [facility] [phone] Office [phone] Cell  

## 2019-02-13 ENCOUNTER — Other Ambulatory Visit: Payer: Self-pay

## 2019-02-13 ENCOUNTER — Ambulatory Visit (HOSPITAL_COMMUNITY)
Admission: RE | Admit: 2019-02-13 | Discharge: 2019-02-13 | Disposition: A | Discharge status: Home / Self Care | Payer: 59 | Source: Ambulatory Visit | Attending: Cardiovascular Disease | Admitting: Cardiovascular Disease

## 2019-02-13 DIAGNOSIS — R9431 Abnormal electrocardiogram [ECG] [EKG]: Secondary | ICD-10-CM | POA: Insufficient documentation

## 2019-02-13 DIAGNOSIS — I2510 Atherosclerotic heart disease of native coronary artery without angina pectoris: Secondary | ICD-10-CM

## 2019-02-13 DIAGNOSIS — I447 Left bundle-branch block, unspecified: Secondary | ICD-10-CM

## 2019-02-13 DIAGNOSIS — I2584 Coronary atherosclerosis due to calcified coronary lesion: Secondary | ICD-10-CM | POA: Insufficient documentation

## 2019-02-13 MED ORDER — IOHEXOL 350 MG/ML SOLN
100.0000 mL | Freq: Once | INTRAVENOUS | Status: AC | PRN
  Administered 2019-02-13: 100 mL via INTRAVENOUS

## 2019-02-13 MED ORDER — NITROGLYCERIN 0.4 MG SL SUBL
0.8000 mg | SUBLINGUAL_TABLET | Freq: Once | SUBLINGUAL | Status: AC
  Administered 2019-02-13: 0.8 mg via SUBLINGUAL

## 2019-02-13 MED ORDER — NITROGLYCERIN 0.4 MG SL SUBL
SUBLINGUAL_TABLET | SUBLINGUAL | Status: AC
  Filled 2019-02-13: qty 2

## 2019-02-14 ENCOUNTER — Ambulatory Visit (HOSPITAL_COMMUNITY)
Admission: RE | Admit: 2019-02-14 | Discharge: 2019-02-14 | Disposition: A | Discharge status: Home / Self Care | Payer: 59 | Source: Ambulatory Visit | Attending: Cardiovascular Disease | Admitting: Cardiovascular Disease

## 2019-02-14 DIAGNOSIS — I2584 Coronary atherosclerosis due to calcified coronary lesion: Secondary | ICD-10-CM | POA: Insufficient documentation

## 2019-02-14 DIAGNOSIS — I2510 Atherosclerotic heart disease of native coronary artery without angina pectoris: Secondary | ICD-10-CM

## 2019-02-14 DIAGNOSIS — R9431 Abnormal electrocardiogram [ECG] [EKG]: Secondary | ICD-10-CM | POA: Insufficient documentation

## 2019-02-14 DIAGNOSIS — I447 Left bundle-branch block, unspecified: Secondary | ICD-10-CM | POA: Insufficient documentation

## 2019-02-15 DIAGNOSIS — I2510 Atherosclerotic heart disease of native coronary artery without angina pectoris: Secondary | ICD-10-CM | POA: Diagnosis not present

## 2019-02-18 ENCOUNTER — Telehealth: Payer: Self-pay | Admitting: Nurse Practitioner

## 2019-02-18 DIAGNOSIS — E785 Hyperlipidemia, unspecified: Secondary | ICD-10-CM

## 2019-02-18 DIAGNOSIS — R931 Abnormal findings on diagnostic imaging of heart and coronary circulation: Secondary | ICD-10-CM

## 2019-02-18 NOTE — Telephone Encounter (Signed)
-----   Message from Thayer Headings, MD sent at 02/15/2019  2:22 PM EST ----- Coronary calcium score of 507. This was 68th percentile for age and sex matched control.  Coronary CT angiogram reveals mild to moderate diffuse coronary artery disease.  The FFR evaluation reveals that none of these lesions are critical.We need to continue with aggressive lipid-lowering.  His last lipid levels were from 2018 and these levels look good.  We need some updated lipid levels, liver enzymes, basic metabolic profile.  Continue rosuvastatin 5 mg a day.   Please see if he can come by next week for fasting labs as noted above.

## 2019-02-22 ENCOUNTER — Other Ambulatory Visit: Payer: 59

## 2019-02-22 ENCOUNTER — Encounter: Payer: Self-pay | Admitting: Cardiovascular Disease

## 2019-02-22 ENCOUNTER — Ambulatory Visit (INDEPENDENT_AMBULATORY_CARE_PROVIDER_SITE_OTHER): Payer: 59 | Admitting: Cardiovascular Disease

## 2019-02-22 ENCOUNTER — Other Ambulatory Visit: Payer: Self-pay

## 2019-02-22 VITALS — BP 116/70 | HR 57 | Ht 70.5 in | Wt 170.0 lb

## 2019-02-22 DIAGNOSIS — E785 Hyperlipidemia, unspecified: Secondary | ICD-10-CM

## 2019-02-22 DIAGNOSIS — I447 Left bundle-branch block, unspecified: Secondary | ICD-10-CM

## 2019-02-22 DIAGNOSIS — I2510 Atherosclerotic heart disease of native coronary artery without angina pectoris: Secondary | ICD-10-CM

## 2019-02-22 DIAGNOSIS — I10 Essential (primary) hypertension: Secondary | ICD-10-CM | POA: Diagnosis not present

## 2019-02-22 DIAGNOSIS — I2584 Coronary atherosclerosis due to calcified coronary lesion: Secondary | ICD-10-CM

## 2019-02-22 DIAGNOSIS — R931 Abnormal findings on diagnostic imaging of heart and coronary circulation: Secondary | ICD-10-CM | POA: Diagnosis not present

## 2019-02-22 NOTE — Patient Instructions (Signed)
Medication Instructions:  Your physician recommends that you continue on your current medications as directed. Please refer to the Current Medication list given to you today.  *If you need a refill on your cardiac medications before your next appointment, please call your pharmacy*  Lab Work: [place] with lipid, Lipoprotein A, Apolipoprotein B, Liver panel  If you have labs (blood work) drawn today and your tests are completely normal, you will receive your results only by: Marland Kitchen MyChart Message (if you have MyChart) OR . A paper copy in the mail If you have any lab test that is abnormal or we need to change your treatment, we will call you to review the results.  Testing/Procedures: None Ordered   Follow-Up: At [facility], you and your health needs are our priority.  As part of our continuing mission to provide you with exceptional heart care, we have created designated Provider Care Teams.  These Care Teams include your primary Cardiologist (physician) and Advanced Practice Providers (APPs -  Physician Assistants and Nurse Practitioners) who all work together to provide you with the care you need, when you need it.  Your next appointment:   1 year(s)  The format for your next appointment:   In Person  Provider:   You may see Mertie Moores, MD or one of the following Advanced Practice Providers on your designated Care Team:    Richardson Dopp, PA-C  [place], [place]  Daune Perch, [place]

## 2019-02-22 NOTE — Progress Notes (Signed)
Ronnie Hardy Date of Birth  October 18, 1945       [facility] Office [address]  [address] [place], [place]  [address]   [place], [place]  [address] [phone]     [phone]   Fax  [phone]    Fax [phone]  Problem List: 1. Hypertension 2. Asthma 3. Coronary artery calcifications   01/07/2013 Ronnie Hardy is a 73 yo with hx of HTN.  Avoids salty or fatty foods.  Works out daily .    He has had HTN for the past 5 years or so.   He has a family hx of heart disease ( mother died of stroke, father died of MI - smoker)   He has throbbing in his throat and then knows that his BP is elevated.   He has not been diagnosed with sleep apnea but admits that he doesn't sleep well.    Feb. 4, 2014: BP has been 130s / 80s.  He was started on Horizant but it seems to be causing some peripheral edema.   Ronnie Hardy is seen back for evaluation of coronary artery calcifications . In March he developed a bid chest cold.   Was still short of breath.   CXR showed some calcifications on the aorta ( and ? Coronary )    A coronary calcium score was 390 which is in the 68% of matched subjects   Was started on a statin and ASA by Dr. Renne Crigler.   Works out 5 times a week. No angina with work outs .   Has some  He goes mountain biking on occasion. Has some dyspnea recently which seems to resolve as he continues to cycle.  The tightness seems to be worse in the afternoon and evening.    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  Had a heart cath by Dr. Lorenda Hatchet 2003.  Cath was normal.     August 24, 2016   Ronnie Hardy is seen today  Had a myoview on Jul 14, 2016 that showed no ischemia and normal LV function Cornaary calcium score was 390 .  No recent chest pain .  No dyspnea   Aug. 20, 2019:  Ronnie Hardy is seen today for eval of his contiued shortness of breath .  Has a hx of asthma. Saw pulmonary last week  Spirometry was normal in the  office Denies any chest pain  Has had dyspnea for 7-8 weeks,   Had a chest cold several weeks ago and this shortness of breath has lingered.  Tightness with inspiration .   Feels like his asthma  Has had 2 steroid dose packs which helped a little  Still able to go to the gym without any chest pain or dyspnea     Oct. 12, 2020   Ronnie Hardy is seen today for follow up visit for his coronary artery calcifications and hyperlipidemia He has had a negative myoview in 2018.  He is back at work now. Has developed a LBBB since I I last saw him   Is working out. Feels well, no CP or dyspnea.    February 22, 2019:  Ronnie Hardy is seen today for follow-up visit.  He has known coronary artery calcifications.  We recently did coronary CT angiogram.    Coarsened coronary calcium score is 507 which is 68th percentile for age/sex matched controls.    The CT angiogram reveals mild plaque in the  left anterior descending artery with moderate plaque in the mid LAD.  The left circumflex artery is small and nondominant and   is free of disease.  The right coronary artery is large and dominant and has mild plaque in the distal RCA associated with a 25 to 49% stenosis.  Takes crestor 5 mg 2-3 times a week.  Getting labs today .  Very active    Current Outpatient Medications on File Prior to Visit  Medication Sig Dispense Refill  . albuterol (PROVENTIL) (2.5 MG/3ML) 0.083% nebulizer solution Take 3 mLs (2.5 mg total) by nebulization every 6 (six) hours as needed for wheezing or shortness of breath. 75 mL 2  . B Complex Vitamins (VITAMIN-B COMPLEX) TABS Take by mouth as directed.     . budesonide-formoterol (SYMBICORT) 160-4.5 MCG/ACT inhaler Inhale 2 puffs into the lungs as needed.    . Eszopiclone (ESZOPICLONE) 3 MG TABS Take 3 mg by mouth at bedtime. Take immediately before bedtime     . famotidine (PEPCID) 10 MG tablet Take 10 mg by mouth at bedtime as needed. For heartburn    . Fluticasone-Salmeterol (ADVAIR) 250-50  MCG/DOSE AEPB Inhale 1 puff into the lungs as needed.    Marland Kitchen MAGNESIUM PO Take 1 tablet by mouth daily.    . Multiple Vitamin (MULTI-VITAMINS) TABS Take 1 tablet by mouth daily.    Marland Kitchen olmesartan (BENICAR) 5 MG tablet Take 5 mg by mouth daily.    . Omega-3 1000 MG CAPS Take 1 capsule by mouth daily.     . rosuvastatin (CRESTOR) 5 MG tablet Take 1 tablet (5 mg total) by mouth 3 (three) times a week. 45 tablet 3  . tetrahydrozoline 0.05 % ophthalmic solution Apply 1 drop to eye as needed (Use as directed for allergies).      No current facility-administered medications on file prior to visit.    Allergies  Allergen Reactions  . Meperidine Nausea And Vomiting  . No Known Allergies     Has some intolerances  . Anesthetics, Amide Nausea Only    Surgical anesthetic    Past Medical History:  Diagnosis Date  . Arthritis    "all over"  . Asthma   . GERD (gastroesophageal reflux disease)   . Hiatal hernia   . Hypertension   . IBS (irritable bowel syndrome)   . PONV (postoperative nausea and vomiting)   . Postoperative ileus ([facility]) 05/22/2014   S/P back OR 05/19/2014    Past Surgical History:  Procedure Laterality Date  . ACHILLES TENDON REPAIR Left 1990's  . BACK SURGERY    . ELBOW SURGERY Bilateral 2000's   "scoped them and took out bone chips"  . EYE SURGERY Left 1980's   "removed splinters"  . POSTERIOR FUSION LUMBAR SPINE, MINIMALLY INVASIVE  04/20/2014  . ROTATOR CUFF REPAIR Left 2002  . SHOULDER ARTHROSCOPY W/ ROTATOR CUFF REPAIR Right 2011  . SHOULDER OPEN ROTATOR CUFF REPAIR Left 1990's; 2000's  . TONSILLECTOMY  1954    Social History   Tobacco Use  Smoking Status Passive Smoke Exposure - Never Smoker  Smokeless Tobacco Never Used  Tobacco Comment   Father & Mother when he was a child.     Social History   Substance and Sexual Activity  Alcohol Use Yes   Comment: 05/22/2014 "a drink q 2 wk or so"    Family History  Problem Relation Age of Onset  . Heart disease  Mother   . Stroke Mother   . Brain cancer  Mother   . Heart disease Father   . Alzheimer's disease Brother   . Asthma Brother   . Asthma Daughter     Reviw of Systems:  Reviewed in the HPI.  All other systems are negative.  Physical Exam: Blood pressure 116/70, pulse (!) 57, height 5' 10.5" (1.791 m), weight 170 lb (77.1 kg), SpO2 98 %.  GEN:  Well nourished, well developed in no acute distress HEENT: Normal NECK: No JVD; No carotid bruits LYMPHATICS: No lymphadenopathy CARDIAC: RRR , no murmurs, rubs, gallops RESPIRATORY:  Clear to auscultation without rales, wheezing or rhonchi  ABDOMEN: Soft, non-tender, non-distended MUSCULOSKELETAL:  No edema; No deformity  SKIN: Warm and dry NEUROLOGIC:  Alert and oriented x 3   ECG:     Assessment / Plan:   1.  coronary calcifications::   coronary calcium score is 507 which is 68th percentile for age/sex matched controls.  Lipids levels have been good.   Takes his crestor 5 mg 2-3 times a week.  Repeat lipids, NMR lipid profile, Apo B, LPa Liver enz, bmp today    2.  LBBB :  Has a new LBBB .   No evicence of severe CAD by CCA      2. Hypertension: BP is well controlled.    Mertie Moores, MD  02/22/2019 10:35 AM    [address],  [place], [place]  [address] Pager [phone] Phone: [phone]; Fax: [phone]

## 2019-02-23 LAB — NMR LIPOPROF + GRAPH
Cholesterol, Total: 158 mg/dL (ref 100–199)
HDL Particle Number: 40.9 umol/L (ref >=30.5)
HDL-C: 82 mg/dL (ref >39)
LDL Particle Number: 378 nmol/L (ref <1000)
LDL-C (NIH Calc): 62 mg/dL (ref 0–99)
LP-IR Score: 35 (ref <=45)
Small LDL Particle Number: <90 nmol/L (ref <=527)
Triglycerides: 71 mg/dL (ref 0–149)

## 2019-02-23 LAB — HEPATIC FUNCTION PANEL
ALT: 8 IU/L (ref 0–44)
AST: 41 IU/L — ABNORMAL HIGH (ref 0–40)
Albumin: 4.2 g/dL (ref 3.7–4.7)
Alkaline Phosphatase: 64 IU/L (ref 39–117)
Bilirubin Total: 0.4 mg/dL (ref 0.0–1.2)
Bilirubin, Direct: 0.16 mg/dL (ref 0.00–0.40)
Total Protein: 6.4 g/dL (ref 6.0–8.5)

## 2019-02-23 LAB — LIPOPROTEIN A (LPA): Lipoprotein (a): 12.5 nmol/L (ref <75.0)

## 2019-02-23 LAB — APOLIPOPROTEIN B: Apolipoprotein B: 41 mg/dL (ref <90)

## 2019-03-04 ENCOUNTER — Telehealth: Payer: Self-pay | Admitting: Acute Care

## 2019-03-04 DIAGNOSIS — R911 Solitary pulmonary nodule: Secondary | ICD-10-CM

## 2019-03-04 NOTE — Telephone Encounter (Signed)
Routing to Judson Roch as an FYI that pt cancelled the CT.   Pt does not have a f/u appt scheduled. Judson Roch, please advise if you want [place] to go ahead and schedule pt a f/u with AO even though he wanted to cancel the CT until June 2021?

## 2019-03-06 NOTE — Telephone Encounter (Signed)
Called and spoke with pt letting him know the info stated by Judson Roch and pt verbalized understanding. Stated to pt that we would repeat the CT in either June/July 2021 and then have him see Dr. Ander Slade after the CT. A recall has been placed in pt's chart as we do not have a schedule out that far.  PCCs, please advise if the order that has been placed previously is okay to use when it is time to schedule pt's CT or if we will need to have a new order?

## 2019-03-06 NOTE — Telephone Encounter (Signed)
New order placed. nothing further needed. 

## 2019-03-06 NOTE — Telephone Encounter (Signed)
He needs a follow up with AO.Please schedule with him in July after he has the CT.  He will need a follow up CT  no later than 09/2019 because he had a new 7 mm nodule noted on his 09/2018 CT chest. The recommendation was for 6-12 month follow up. He has a history of passive smoke exposure, which puts him in a higher risk category which is why we had opted to schedule a 6 month follow up vs a 12 month follow up. Please let him know if he goes beyond the 12 month follow up  ( (09/2019)  he is not following the recommendation of the radiologist or our practice.Thanks so much

## 2019-03-06 NOTE — Telephone Encounter (Signed)
We will need a new one please

## 2019-03-18 ENCOUNTER — Other Ambulatory Visit: Payer: 59

## 2019-08-07 ENCOUNTER — Other Ambulatory Visit: Payer: 59

## 2019-09-13 ENCOUNTER — Other Ambulatory Visit: Payer: Self-pay

## 2019-09-13 ENCOUNTER — Ambulatory Visit (INDEPENDENT_AMBULATORY_CARE_PROVIDER_SITE_OTHER)
Admission: RE | Admit: 2019-09-13 | Discharge: 2019-09-13 | Disposition: A | Discharge status: Home / Self Care | Payer: 59 | Source: Ambulatory Visit | Attending: Acute Care | Admitting: Acute Care

## 2019-09-13 DIAGNOSIS — R911 Solitary pulmonary nodule: Secondary | ICD-10-CM | POA: Diagnosis not present

## 2019-09-17 NOTE — Progress Notes (Signed)
Please let patient know the nodules noted on the previous CT exam are stable. Recommendation is for 12 month follow up. Please schedule for follow up with Dr. Wynona Neat as new primary MD in the practice.

## 2019-09-18 ENCOUNTER — Other Ambulatory Visit: Payer: Self-pay

## 2019-09-18 DIAGNOSIS — R911 Solitary pulmonary nodule: Secondary | ICD-10-CM

## 2019-09-19 ENCOUNTER — Other Ambulatory Visit: Payer: Self-pay | Admitting: Acute Care

## 2019-09-19 DIAGNOSIS — R911 Solitary pulmonary nodule: Secondary | ICD-10-CM

## 2020-06-01 ENCOUNTER — Encounter: Payer: Self-pay | Admitting: Cardiovascular Disease

## 2020-06-01 NOTE — Progress Notes (Signed)
Ronnie Hardy Date of Birth  October 18, 1945       [facility] Office [address]  [address], [address] [place], [place]  [address]   [place], [place]  [address] [phone]     [phone]   Fax  [phone]    Fax [phone]  Problem List: 1. Hypertension 2. Asthma 3. Coronary artery calcifications   01/07/2013 Ronnie Hardy is a 75 yo with hx of HTN.  Avoids salty or fatty foods.  Works out daily .    He has had HTN for the past 5 years or so.   He has a family hx of heart disease ( mother died of stroke, father died of MI - smoker)   He has throbbing in his throat and then knows that his BP is elevated.   He has not been diagnosed with sleep apnea but admits that he doesn't sleep well.    Feb. 4, 2014: BP has been 130s / 80s.  He was started on Horizant but it seems to be causing some peripheral edema.   Ronnie Hardy is seen back for evaluation of coronary artery calcifications . In March he developed a bid chest cold.   Was still short of breath.   CXR showed some calcifications on the aorta ( and ? Coronary )    A coronary calcium score was 390 which is in the 68% of matched subjects   Was started on a statin and ASA by Dr. Renne Crigler.   Works out 5 times a week. No angina with work outs .   Has some  He goes mountain biking on occasion. Has some dyspnea recently which seems to resolve as he continues to cycle.  The tightness seems to be worse in the afternoon and evening.    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  Had a heart cath by Dr. Lorenda Hatchet 2003.  Cath was normal.     August 24, 2016   Ronnie Hardy is seen today  Had a myoview on Jul 14, 2016 that showed no ischemia and normal LV function Cornaary calcium score was 390 .  No recent chest pain .  No dyspnea   Aug. 20, 2019:  Ronnie Hardy is seen today for eval of his contiued shortness of breath .  Has a hx of asthma. Saw pulmonary last week  Spirometry was normal in the  office Denies any chest pain  Has had dyspnea for 7-8 weeks,   Had a chest cold several weeks ago and this shortness of breath has lingered.  Tightness with inspiration .   Feels like his asthma  Has had 2 steroid dose packs which helped a little  Still able to go to the gym without any chest pain or dyspnea     Oct. 12, 2020   Ronnie Hardy is seen today for follow up visit for his coronary artery calcifications and hyperlipidemia He has had a negative myoview in 2018.  He is back at work now. Has developed a LBBB since I I last saw him   Is working out. Feels well, no CP or dyspnea.    February 22, 2019:  Ronnie Hardy is seen today for follow-up visit.  He has known coronary artery calcifications.  We recently did coronary CT angiogram.    Coarsened coronary calcium score is 507 which is 68th percentile for age/sex matched controls.    The CT angiogram reveals mild plaque in the  left anterior descending artery with moderate plaque in the mid LAD.  The left circumflex artery is small and nondominant and   is free of disease.  The right coronary artery is large and dominant and has mild plaque in the distal RCA associated with a 25 to 49% stenosis.  Takes crestor 5 mg 2-3 times a week.  Getting labs today .  Very active    June 02, 2020: Ronnie Hardy is seen today for follow up of his CAD Cor CTA shows : coronary calcium score is 507 which is 68th percentile for age/sex matched controls.    The CT angiogram reveals mild plaque in the left anterior descending artery with moderate plaque in the mid LAD.  The left circumflex artery is small and nondominant and   is free of disease.  The right coronary artery is large and dominant and has mild plaque in the distal RCA associated with a 25 to 49% stenosis We have taken an aggressive approach to his lipids   Has had more shortness of breath recently + chest tightness - especially at night ,  The tightness was new and different which prompted him to call for an  appt.  he has a long hx of asthma so he was unsure if this was an actual cardiac issue vs. Asthma.   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 albuterol (PROVENTIL) (2.5 MG/3ML) 0.083% nebulizer solution Take 3 mLs (2.5 mg total) by nebulization every 6 (six) hours as needed for wheezing or shortness of breath. 75 mL 2  . B Complex Vitamins (VITAMIN-B COMPLEX) TABS Take by mouth as directed.     . budesonide-formoterol (SYMBICORT) 160-4.5 MCG/ACT inhaler Inhale 2 puffs into the lungs as needed.    . Eszopiclone 3 MG TABS Take 3 mg by mouth at bedtime. Take immediately before bedtime    . famotidine (PEPCID) 10 MG tablet Take 10 mg by mouth at bedtime as needed. For heartburn    . MAGNESIUM PO Take 1 tablet by mouth daily.    . Multiple Vitamin (MULTI-VITAMINS) TABS Take 1 tablet by mouth daily.    Marland Kitchen olmesartan (BENICAR) 5 MG tablet Take 5 mg by mouth daily.    . Omega-3 1000 MG CAPS Take 1 capsule by mouth daily.     . rosuvastatin (CRESTOR) 5 MG tablet Take 1 tablet (5 mg total) by mouth 3 (three) times a week. 45 tablet 3  . sildenafil (REVATIO) 20 MG tablet Take 1 tablet by mouth as needed.    . tamsulosin (FLOMAX) 0.4 MG CAPS capsule Take 1 capsule by mouth as needed.     No current facility-administered medications on file prior to visit.    Allergies  Allergen Reactions  . Meperidine Nausea And Vomiting  . No Known Allergies     Has some intolerances  . Anesthetics, Amide Nausea Only    Surgical anesthetic    Past Medical History:  Diagnosis Date  . Arthritis    "all over"  . Asthma   . GERD (gastroesophageal reflux disease)   . Hiatal hernia   . Hypertension   . IBS (irritable bowel syndrome)   . PONV (postoperative nausea and vomiting)   . Postoperative ileus (HCC) 05/22/2014   S/P back OR 05/19/2014     Past  Surgical History:  Procedure Laterality Date  . ACHILLES TENDON REPAIR Left 1990's  . BACK SURGERY    . ELBOW SURGERY Bilateral 2000's   "scoped them and took out bone chips"  . EYE SURGERY Left 1980's   "removed splinters"  . POSTERIOR FUSION LUMBAR SPINE, MINIMALLY INVASIVE  04/20/2014  . ROTATOR CUFF REPAIR Left 2002  . SHOULDER ARTHROSCOPY W/ ROTATOR CUFF REPAIR Right 2011  . SHOULDER OPEN ROTATOR CUFF REPAIR Left 1990's; 2000's  . TONSILLECTOMY  1954    Social History   Tobacco Use  Smoking Status Passive Smoke Exposure - Never Smoker  Smokeless Tobacco Never Used  Tobacco Comment   Father & Mother when he was a child.     Social History   Substance and Sexual Activity  Alcohol Use Yes   Comment: 05/22/2014 "a drink q 2 wk or so"    Family History  Problem Relation Age of Onset  . Heart disease Mother   . Stroke Mother   . Brain cancer Mother   . Heart disease Father   . Alzheimer's disease Brother   . Asthma Brother   . Asthma Daughter     Reviw of Systems:  Reviewed in the HPI.  All other systems are negative.  Physical Exam: Blood pressure 114/82, pulse (!) 48, height 5' 10.5" (1.791 m), weight 169 lb 6.4 oz (76.8 kg), SpO2 98 %.  GEN:  Well nourished, well developed in no acute distress HEENT: Normal NECK: No JVD; No carotid bruits LYMPHATICS: No lymphadenopathy CARDIAC: RRR  RESPIRATORY:  Clear to auscultation without rales, wheezing or rhonchi  ABDOMEN: Soft, non-tender, non-distended MUSCULOSKELETAL:  No edema; No deformity  SKIN: Warm and dry NEUROLOGIC:  Alert and oriented x 3    ECG: June 02, 2020: Sinus bradycardia.  Left bundle branch block.  He has new T wave inversions in leads V1 through V3.  These T wave inversions are new compared to his previous EKG.    Assessment / Plan:   1.  Unstable angina: Ronnie Hardy presents for further evaluation of some symptoms that are worrisome for unstable angina.  In addition, he has new T wave  inversions in leads V1 through V3 which are concerning.  He is not having any acute pain right now.   We scheduled him a heart catheterization with Dr. Clifton James on Friday. We have discussed the risk, benefits, options of heart catheterization.  He understands and agrees to proceed.   2.  LBBB :   Has new TWI     2. Hypertension:  BP is well controlled.      Kristeen Miss, MD  06/02/2020 12:35 PM    [facility] [address] [place],  [address] [place], [place]  [address] Pager [phone] Phone: [phone]; Fax: [phone]

## 2020-06-01 NOTE — H&P (View-Only) (Signed)
Ronnie Hardy Date of Birth  October 18, 1945       [facility] Office [address]  [address] [place], [place]  [address]   [place], [place]  [address] [phone]     [phone]   Fax  [phone]    Fax [phone]  Problem List: 1. Hypertension 2. Asthma 3. Coronary artery calcifications   01/07/2013 Ronnie Hardy is a 75 yo with hx of HTN.  Avoids salty or fatty foods.  Works out daily .    He has had HTN for the past 5 years or so.   He has a family hx of heart disease ( mother died of stroke, father died of MI - smoker)   He has throbbing in his throat and then knows that his BP is elevated.   He has not been diagnosed with sleep apnea but admits that he doesn't sleep well.    Feb. 4, 2014: BP has been 130s / 80s.  He was started on Horizant but it seems to be causing some peripheral edema.   Ronnie Hardy is seen back for evaluation of coronary artery calcifications . In March he developed a bid chest cold.   Was still short of breath.   CXR showed some calcifications on the aorta ( and ? Coronary )    A coronary calcium score was 390 which is in the 68% of matched subjects   Was started on a statin and ASA by Dr. Renne Crigler.   Works out 5 times a week. No angina with work outs .   Has some  He goes mountain biking on occasion. Has some dyspnea recently which seems to resolve as he continues to cycle.  The tightness seems to be worse in the afternoon and evening.    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  Had a heart cath by Dr. Lorenda Hatchet 2003.  Cath was normal.     August 24, 2016   Ronnie Hardy is seen today  Had a myoview on Jul 14, 2016 that showed no ischemia and normal LV function Cornaary calcium score was 390 .  No recent chest pain .  No dyspnea   Aug. 20, 2019:  Ronnie Hardy is seen today for eval of his contiued shortness of breath .  Has a hx of asthma. Saw pulmonary last week  Spirometry was normal in the  office Denies any chest pain  Has had dyspnea for 7-8 weeks,   Had a chest cold several weeks ago and this shortness of breath has lingered.  Tightness with inspiration .   Feels like his asthma  Has had 2 steroid dose packs which helped a little  Still able to go to the gym without any chest pain or dyspnea     Oct. 12, 2020   Ronnie Hardy is seen today for follow up visit for his coronary artery calcifications and hyperlipidemia He has had a negative myoview in 2018.  He is back at work now. Has developed a LBBB since I I last saw him   Is working out. Feels well, no CP or dyspnea.    February 22, 2019:  Ronnie Hardy is seen today for follow-up visit.  He has known coronary artery calcifications.  We recently did coronary CT angiogram.    Coarsened coronary calcium score is 507 which is 68th percentile for age/sex matched controls.    The CT angiogram reveals mild plaque in the  left anterior descending artery with moderate plaque in the mid LAD.  The left circumflex artery is small and nondominant and   is free of disease.  The right coronary artery is large and dominant and has mild plaque in the distal RCA associated with a 25 to 49% stenosis.  Takes crestor 5 mg 2-3 times a week.  Getting labs today .  Very active    June 02, 2020: Ronnie Hardy is seen today for follow up of his CAD Cor CTA shows : coronary calcium score is 507 which is 68th percentile for age/sex matched controls.    The CT angiogram reveals mild plaque in the left anterior descending artery with moderate plaque in the mid LAD.  The left circumflex artery is small and nondominant and   is free of disease.  The right coronary artery is large and dominant and has mild plaque in the distal RCA associated with a 25 to 49% stenosis We have taken an aggressive approach to his lipids   Has had more shortness of breath recently + chest tightness - especially at night ,  The tightness was new and different which prompted him to call for an  appt.  he has a long hx of asthma so he was unsure if this was an actual cardiac issue vs. Asthma.   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 albuterol (PROVENTIL) (2.5 MG/3ML) 0.083% nebulizer solution Take 3 mLs (2.5 mg total) by nebulization every 6 (six) hours as needed for wheezing or shortness of breath. 75 mL 2  . B Complex Vitamins (VITAMIN-B COMPLEX) TABS Take by mouth as directed.     . budesonide-formoterol (SYMBICORT) 160-4.5 MCG/ACT inhaler Inhale 2 puffs into the lungs as needed.    . Eszopiclone 3 MG TABS Take 3 mg by mouth at bedtime. Take immediately before bedtime    . famotidine (PEPCID) 10 MG tablet Take 10 mg by mouth at bedtime as needed. For heartburn    . MAGNESIUM PO Take 1 tablet by mouth daily.    . Multiple Vitamin (MULTI-VITAMINS) TABS Take 1 tablet by mouth daily.    . olmesartan (BENICAR) 5 MG tablet Take 5 mg by mouth daily.    . Omega-3 1000 MG CAPS Take 1 capsule by mouth daily.     . rosuvastatin (CRESTOR) 5 MG tablet Take 1 tablet (5 mg total) by mouth 3 (three) times a week. 45 tablet 3  . sildenafil (REVATIO) 20 MG tablet Take 1 tablet by mouth as needed.    . tamsulosin (FLOMAX) 0.4 MG CAPS capsule Take 1 capsule by mouth as needed.     No current facility-administered medications on file prior to visit.    Allergies  Allergen Reactions  . Meperidine Nausea And Vomiting  . No Known Allergies     Has some intolerances  . Anesthetics, Amide Nausea Only    Surgical anesthetic    Past Medical History:  Diagnosis Date  . Arthritis    "all over"  . Asthma   . GERD (gastroesophageal reflux disease)   . Hiatal hernia   . Hypertension   . IBS (irritable bowel syndrome)   . PONV (postoperative nausea and vomiting)   . Postoperative ileus (HCC) 05/22/2014   S/P back OR 05/19/2014     Past   Surgical History:  Procedure Laterality Date  . ACHILLES TENDON REPAIR Left 1990's  . BACK SURGERY    . ELBOW SURGERY Bilateral 2000's   "scoped them and took out bone chips"  . EYE SURGERY Left 1980's   "removed splinters"  . POSTERIOR FUSION LUMBAR SPINE, MINIMALLY INVASIVE  04/20/2014  . ROTATOR CUFF REPAIR Left 2002  . SHOULDER ARTHROSCOPY W/ ROTATOR CUFF REPAIR Right 2011  . SHOULDER OPEN ROTATOR CUFF REPAIR Left 1990's; 2000's  . TONSILLECTOMY  1954    Social History   Tobacco Use  Smoking Status Passive Smoke Exposure - Never Smoker  Smokeless Tobacco Never Used  Tobacco Comment   Father & Mother when he was a child.     Social History   Substance and Sexual Activity  Alcohol Use Yes   Comment: 05/22/2014 "a drink q 2 wk or so"    Family History  Problem Relation Age of Onset  . Heart disease Mother   . Stroke Mother   . Brain cancer Mother   . Heart disease Father   . Alzheimer's disease Brother   . Asthma Brother   . Asthma Daughter     Reviw of Systems:  Reviewed in the HPI.  All other systems are negative.  Physical Exam: Blood pressure 114/82, pulse (!) 48, height 5' 10.5" (1.791 m), weight 169 lb 6.4 oz (76.8 kg), SpO2 98 %.  GEN:  Well nourished, well developed in no acute distress HEENT: Normal NECK: No JVD; No carotid bruits LYMPHATICS: No lymphadenopathy CARDIAC: RRR  RESPIRATORY:  Clear to auscultation without rales, wheezing or rhonchi  ABDOMEN: Soft, non-tender, non-distended MUSCULOSKELETAL:  No edema; No deformity  SKIN: Warm and dry NEUROLOGIC:  Alert and oriented x 3    ECG: June 02, 2020: Sinus bradycardia.  Left bundle branch block.  He has new T wave inversions in leads V1 through V3.  These T wave inversions are new compared to his previous EKG.    Assessment / Plan:   1.  Unstable angina: Ronnie Hardy presents for further evaluation of some symptoms that are worrisome for unstable angina.  In addition, he has new T wave  inversions in leads V1 through V3 which are concerning.  He is not having any acute pain right now.   We scheduled him a heart catheterization with Dr. Clifton James on Friday. We have discussed the risk, benefits, options of heart catheterization.  He understands and agrees to proceed.   2.  LBBB :   Has new TWI     2. Hypertension:  BP is well controlled.      Kristeen Miss, MD  06/02/2020 12:35 PM    [facility] [address] [place],  [address] [place], [place]  [address] Pager [phone] Phone: [phone]; Fax: [phone]

## 2020-06-02 ENCOUNTER — Encounter: Payer: Self-pay | Admitting: Cardiovascular Disease

## 2020-06-02 ENCOUNTER — Other Ambulatory Visit: Payer: Self-pay

## 2020-06-02 ENCOUNTER — Ambulatory Visit (INDEPENDENT_AMBULATORY_CARE_PROVIDER_SITE_OTHER): Payer: 59 | Admitting: Cardiovascular Disease

## 2020-06-02 VITALS — BP 114/82 | HR 48 | Ht 70.5 in | Wt 169.4 lb

## 2020-06-02 DIAGNOSIS — I25110 Atherosclerotic heart disease of native coronary artery with unstable angina pectoris: Secondary | ICD-10-CM | POA: Diagnosis not present

## 2020-06-02 DIAGNOSIS — I447 Left bundle-branch block, unspecified: Secondary | ICD-10-CM | POA: Diagnosis not present

## 2020-06-02 MED ORDER — NITROGLYCERIN 0.4 MG SL SUBL: 0.4000 mg | SUBLINGUAL_TABLET | SUBLINGUAL | 3 refills | Status: DC | PRN

## 2020-06-02 MED ORDER — ASPIRIN EC 81 MG PO TBEC: 81.0000 mg | DELAYED_RELEASE_TABLET | Freq: Every day | ORAL | 3 refills | Status: DC

## 2020-06-02 NOTE — Patient Instructions (Addendum)
Medication Instructions:  Your physician has recommended you make the following change in your medication:  Nitroglycerin as needed   *If you need a refill on your cardiac medications before your next appointment, please call your pharmacy*   Lab Work: Today: lipids,alt, bmet If you have labs (blood work) drawn today and your tests are completely normal, you will receive your results only by: Marland Kitchen MyChart Message (if you have MyChart) OR . A paper copy in the mail If you have any lab test that is abnormal or we need to change your treatment, we will call you to review the results.   Testing/Procedures: Your physician has requested that you have an echocardiogram. Echocardiography is a painless test that uses sound waves to create images of your heart. It provides your doctor with information about the size and shape of your heart and how well your heart's chambers and valves are working. This procedure takes approximately one hour. There are no restrictions for this procedure.     Follow-Up: At [facility], you and your health needs are our priority.  As part of our continuing mission to provide you with exceptional heart care, we have created designated Provider Care Teams.  These Care Teams include your primary Cardiologist (physician) and Advanced Practice Providers (APPs -  Physician Assistants and Nurse Practitioners) who all work together to provide you with the care you need, when you need it.  We recommend signing up for the patient portal called "MyChart".  Sign up information is provided on this After Visit Summary.  MyChart is used to connect with patients for Virtual Visits (Telemedicine).  Patients are able to view lab/test results, encounter notes, upcoming appointments, etc.  Non-urgent messages can be sent to your provider as well.   To learn more about what you can do with MyChart, go to [email or web address].    Your next appointment:   07/10/2020 at 1:20pm 1  month(s)  The format for your next appointment:   In Person  Provider:   You may see Kristeen Miss, MD or one of the following Advanced Practice Providers on your designated Care Team:    Tereso Newcomer, PA-C  Chelsea Aus, [place]    Other Instructions    [facility] CARDIOVASCULAR DIVISION [facility] [address] OFFICE [address] [place] [address] Dept: [phone] Loc: [phone]  Colleen Donahoe  06/02/2020  You are scheduled for a Cardiac Catheterization on Friday, April 1 with Dr. Verne Carrow.  1. Please arrive at the [facility] (Main Entrance A) at [facility]: [address] [place], [place] [address] at 7:00 AM (This time is two hours before your procedure to ensure your preparation). Free valet parking service is available.   Special note: Every effort is made to have your procedure done on time. Please understand that emergencies sometimes delay scheduled procedures.  2. Diet: Do not eat solid foods after midnight.  The patient may have clear liquids until 5am upon the day of the procedure.  3. Labs: You will need to have blood drawn Today Medication instructions in preparation for your procedure:  Due to recent COVID-19 restrictions implemented by our local and state authorities and in an effort to keep both patients and staff as safe as possible, our hospital system requires COVID-19 testing prior to certain scheduled hospital procedures.  Please go to [address] [facility] [place], [place] [address] on 3/30 at 12:20pm  .  This is a drive up  testing site.  You will not need to exit your vehicle.  You will not be billed at the time of testing but may receive a bill later depending on your insurance. You must agree to self-quarantine from the time of your testing until the procedure date on 4/1.  This should included staying home with ONLY the people you live with.  Avoid take-out, grocery store shopping or  leaving the house for any non-emergent reason.  Failure to have your COVID-19 test done on the date and time you have been scheduled will result in cancellation of your procedure.  Please call our office at [phone] if you have any questions.   Contrast Allergy: No  STOP Sildenafil      On the morning of your procedure, take your Aspirin and any morning medicines NOT listed above.  You may use sips of water.  5. Plan for one night stay--bring personal belongings. 6. Bring a current list of your medications and current insurance cards. 7. You MUST have a responsible person to drive you home. 8. Someone MUST be with you the first 24 hours after you arrive home or your discharge will be delayed. 9. Please wear clothes that are easy to get on and off and wear slip-on shoes.  Thank you for allowing [place] to care for you!   -- [facility]

## 2020-06-03 ENCOUNTER — Other Ambulatory Visit: Payer: Self-pay

## 2020-06-03 ENCOUNTER — Other Ambulatory Visit (HOSPITAL_COMMUNITY)
Admission: RE | Admit: 2020-06-03 | Discharge: 2020-06-03 | Disposition: A | Discharge status: Home / Self Care | Payer: 59 | Source: Ambulatory Visit | Attending: Cardiovascular Disease | Admitting: Cardiovascular Disease

## 2020-06-03 DIAGNOSIS — Z01812 Encounter for preprocedural laboratory examination: Secondary | ICD-10-CM | POA: Diagnosis not present

## 2020-06-03 DIAGNOSIS — Z20822 Contact with and (suspected) exposure to covid-19: Secondary | ICD-10-CM | POA: Insufficient documentation

## 2020-06-03 LAB — CBC
Hematocrit: 40.3 % (ref 37.5–51.0)
Hemoglobin: 13.8 g/dL (ref 13.0–17.7)
MCH: 31 pg (ref 26.6–33.0)
MCHC: 34.2 g/dL (ref 31.5–35.7)
MCV: 91 fL (ref 79–97)
Platelets: 192 10*3/uL (ref 150–450)
RBC: 4.45 x10E6/uL (ref 4.14–5.80)
RDW: 13.5 % (ref 11.6–15.4)
WBC: 6.2 10*3/uL (ref 3.4–10.8)

## 2020-06-03 LAB — BASIC METABOLIC PANEL
BUN/Creatinine Ratio: 15 (ref 10–24)
BUN: 13 mg/dL (ref 8–27)
CO2: 21 mmol/L (ref 20–29)
Calcium: 9.6 mg/dL (ref 8.6–10.2)
Chloride: 95 mmol/L — ABNORMAL LOW (ref 96–106)
Creatinine, Ser: 0.84 mg/dL (ref 0.76–1.27)
Glucose: 88 mg/dL (ref 65–99)
Potassium: 4.9 mmol/L (ref 3.5–5.2)
Sodium: 133 mmol/L — ABNORMAL LOW (ref 134–144)
eGFR: 92 mL/min/{1.73_m2} (ref >59)

## 2020-06-03 LAB — LIPID PANEL
Chol/HDL Ratio: 1.6 ratio (ref 0.0–5.0)
Cholesterol, Total: 159 mg/dL (ref 100–199)
HDL: 102 mg/dL (ref >39)
LDL Chol Calc (NIH): 44 mg/dL (ref 0–99)
Triglycerides: 63 mg/dL (ref 0–149)
VLDL Cholesterol Cal: 13 mg/dL (ref 5–40)

## 2020-06-03 LAB — ALT: ALT: 11 IU/L (ref 0–44)

## 2020-06-03 LAB — SARS CORONAVIRUS 2 (TAT 6-24 HRS): SARS Coronavirus 2: NEGATIVE

## 2020-06-03 NOTE — Progress Notes (Signed)
CBC ordered for heart cath scheduled for 4/1.

## 2020-06-04 ENCOUNTER — Telehealth: Payer: Self-pay | Admitting: *Deleted

## 2020-06-04 NOTE — Telephone Encounter (Signed)
Pt contacted pre-catheterization scheduled at [facility] for: Friday June 05, 2020 9 AM Verified arrival time and place: [facility] Main Entrance A ([place]) at: 7 AM   No solid food after midnight prior to cath, clear liquids until 5 AM day of procedure.   AM meds can be  taken pre-cath with sips of water including: ASA 81 mg   Confirmed patient has responsible adult to drive home post procedure and be with patient first 24 hours after arriving home: yes  You are allowed ONE visitor in the waiting room during the time you are at the hospital for your procedure. Both you and your visitor must wear a mask once you enter the hospital.    Reviewed procedure/mask/visitor instructions with patient.          

## 2020-06-05 ENCOUNTER — Ambulatory Visit (HOSPITAL_COMMUNITY)
Admission: RE | Admit: 2020-06-05 | Discharge: 2020-06-05 | Disposition: A | Discharge status: Home / Self Care | Payer: 59 | Attending: Cardiovascular Disease | Admitting: Cardiovascular Disease

## 2020-06-05 ENCOUNTER — Encounter (HOSPITAL_COMMUNITY)
Admission: RE | Disposition: A | Discharge status: Home / Self Care | Payer: Self-pay | Source: Home / Self Care | Attending: Cardiovascular Disease

## 2020-06-05 ENCOUNTER — Encounter (HOSPITAL_COMMUNITY): Payer: Self-pay | Admitting: Cardiovascular Disease

## 2020-06-05 DIAGNOSIS — R079 Chest pain, unspecified: Secondary | ICD-10-CM | POA: Diagnosis not present

## 2020-06-05 DIAGNOSIS — I2510 Atherosclerotic heart disease of native coronary artery without angina pectoris: Secondary | ICD-10-CM

## 2020-06-05 DIAGNOSIS — J45909 Unspecified asthma, uncomplicated: Secondary | ICD-10-CM | POA: Diagnosis not present

## 2020-06-05 DIAGNOSIS — I25110 Atherosclerotic heart disease of native coronary artery with unstable angina pectoris: Secondary | ICD-10-CM | POA: Diagnosis not present

## 2020-06-05 DIAGNOSIS — Z8616 Personal history of COVID-19: Secondary | ICD-10-CM | POA: Insufficient documentation

## 2020-06-05 DIAGNOSIS — I10 Essential (primary) hypertension: Secondary | ICD-10-CM | POA: Insufficient documentation

## 2020-06-05 DIAGNOSIS — R9431 Abnormal electrocardiogram [ECG] [EKG]: Secondary | ICD-10-CM

## 2020-06-05 DIAGNOSIS — Z79899 Other long term (current) drug therapy: Secondary | ICD-10-CM | POA: Diagnosis not present

## 2020-06-05 DIAGNOSIS — I447 Left bundle-branch block, unspecified: Secondary | ICD-10-CM | POA: Diagnosis not present

## 2020-06-05 HISTORY — PX: LEFT HEART CATH AND CORONARY ANGIOGRAPHY: CATH118249

## 2020-06-05 SURGERY — LEFT HEART CATH AND CORONARY ANGIOGRAPHY
Anesthesia: LOCAL

## 2020-06-05 MED ORDER — SODIUM CHLORIDE 0.9 % WEIGHT BASED INFUSION: 1.0000 mL/kg/h | INTRAVENOUS | Status: DC

## 2020-06-05 MED ORDER — SODIUM CHLORIDE 0.9 % IV SOLN: 250.0000 mL | INTRAVENOUS | Status: DC | PRN

## 2020-06-05 MED ORDER — SODIUM CHLORIDE 0.9% FLUSH: 3.0000 mL | Freq: Two times a day (BID) | INTRAVENOUS | Status: DC

## 2020-06-05 MED ORDER — SODIUM CHLORIDE 0.9% FLUSH: 3.0000 mL | INTRAVENOUS | Status: DC | PRN

## 2020-06-05 MED ORDER — ASPIRIN 81 MG PO CHEW: 81.0000 mg | CHEWABLE_TABLET | ORAL | Status: DC

## 2020-06-05 MED ORDER — FENTANYL CITRATE (PF) 100 MCG/2ML IJ SOLN
INTRAMUSCULAR | Status: AC
  Filled 2020-06-05: qty 2

## 2020-06-05 MED ORDER — MIDAZOLAM HCL 2 MG/2ML IJ SOLN
INTRAMUSCULAR | Status: AC
  Filled 2020-06-05: qty 2

## 2020-06-05 MED ORDER — HEPARIN (PORCINE) IN NACL 1000-0.9 UT/500ML-% IV SOLN
INTRAVENOUS | Status: DC | PRN
  Administered 2020-06-05 (×2): 500 mL

## 2020-06-05 MED ORDER — FENTANYL CITRATE (PF) 100 MCG/2ML IJ SOLN
INTRAMUSCULAR | Status: DC | PRN
  Administered 2020-06-05: 50 ug via INTRAVENOUS

## 2020-06-05 MED ORDER — VERAPAMIL HCL 2.5 MG/ML IV SOLN
INTRAVENOUS | Status: AC
  Filled 2020-06-05: qty 2

## 2020-06-05 MED ORDER — ACETAMINOPHEN 325 MG PO TABS: 650.0000 mg | ORAL_TABLET | ORAL | Status: DC | PRN

## 2020-06-05 MED ORDER — VERAPAMIL HCL 2.5 MG/ML IV SOLN
INTRAVENOUS | Status: DC | PRN
  Administered 2020-06-05: 10 mL via INTRA_ARTERIAL

## 2020-06-05 MED ORDER — HEPARIN (PORCINE) IN NACL 1000-0.9 UT/500ML-% IV SOLN
INTRAVENOUS | Status: AC
  Filled 2020-06-05: qty 1000

## 2020-06-05 MED ORDER — LABETALOL HCL 5 MG/ML IV SOLN
10.0000 mg | INTRAVENOUS | Status: DC | PRN
Start: 2020-06-05 — End: 2020-06-05

## 2020-06-05 MED ORDER — SODIUM CHLORIDE 0.9 % WEIGHT BASED INFUSION
3.0000 mL/kg/h | INTRAVENOUS | Status: AC
  Administered 2020-06-05: 3 mL/kg/h via INTRAVENOUS

## 2020-06-05 MED ORDER — HYDRALAZINE HCL 20 MG/ML IJ SOLN: 10.0000 mg | INTRAMUSCULAR | Status: DC | PRN

## 2020-06-05 MED ORDER — IOHEXOL 350 MG/ML SOLN
INTRAVENOUS | Status: DC | PRN
  Administered 2020-06-05: 65 mL

## 2020-06-05 MED ORDER — SODIUM CHLORIDE 0.9 % IV SOLN: INTRAVENOUS | Status: AC

## 2020-06-05 MED ORDER — ONDANSETRON HCL 4 MG/2ML IJ SOLN: 4.0000 mg | Freq: Four times a day (QID) | INTRAMUSCULAR | Status: DC | PRN

## 2020-06-05 MED ORDER — HEPARIN SODIUM (PORCINE) 1000 UNIT/ML IJ SOLN
INTRAMUSCULAR | Status: DC | PRN
  Administered 2020-06-05: 4000 [IU] via INTRAVENOUS

## 2020-06-05 MED ORDER — HEPARIN SODIUM (PORCINE) 1000 UNIT/ML IJ SOLN
INTRAMUSCULAR | Status: AC
  Filled 2020-06-05: qty 1

## 2020-06-05 MED ORDER — LIDOCAINE HCL (PF) 1 % IJ SOLN
INTRAMUSCULAR | Status: AC
  Filled 2020-06-05: qty 30

## 2020-06-05 MED ORDER — LIDOCAINE HCL (PF) 1 % IJ SOLN
INTRAMUSCULAR | Status: DC | PRN
  Administered 2020-06-05: 2 mL

## 2020-06-05 MED ORDER — MIDAZOLAM HCL 2 MG/2ML IJ SOLN
INTRAMUSCULAR | Status: DC | PRN
  Administered 2020-06-05: 2 mg via INTRAVENOUS

## 2020-06-05 SURGICAL SUPPLY — 9 items
CATH 5FR JL3.5 JR4 ANG PIG MP (CATHETERS) ×2 IMPLANT
DEVICE RAD TR BAND REGULAR (VASCULAR PRODUCTS) ×2 IMPLANT
GLIDESHEATH SLEND SS 6F .021 (SHEATH) ×2 IMPLANT
GUIDEWIRE INQWIRE 1.5J.035X260 (WIRE) ×1 IMPLANT
INQWIRE 1.5J .035X260CM (WIRE) ×2
KIT HEART LEFT (KITS) ×2 IMPLANT
PACK CARDIAC CATHETERIZATION (CUSTOM PROCEDURE TRAY) ×2 IMPLANT
TRANSDUCER W/STOPCOCK (MISCELLANEOUS) ×2 IMPLANT
TUBING CIL FLEX 10 FLL-RA (TUBING) ×2 IMPLANT

## 2020-06-05 NOTE — Discharge Instructions (Signed)
Radial Site Care  This sheet gives you information about how to care for yourself after your procedure. Your health care provider may also give you more specific instructions. If you have problems or questions, contact your health care provider. What can I expect after the procedure? After the procedure, it is common to have:  Bruising and tenderness at the catheter insertion area. Follow these instructions at home: Medicines  Take over-the-counter and prescription medicines only as told by your health care provider. Insertion site care  Follow instructions from your health care provider about how to take care of your insertion site. Make sure you: ? Wash your hands with soap and water before you change your bandage (dressing). If soap and water are not available, use hand sanitizer. ? Change your dressing as told by your health care provider. ? Leave stitches (sutures), skin glue, or adhesive strips in place. These skin closures may need to stay in place for 2 weeks or longer. If adhesive strip edges start to loosen and curl up, you may trim the loose edges. Do not remove adhesive strips completely unless your health care provider tells you to do that.  Check your insertion site every day for signs of infection. Check for: ? Redness, swelling, or pain. ? Fluid or blood. ? Pus or a bad smell. ? Warmth.  Do not take baths, swim, or use a hot tub until your health care provider approves.  You may shower 24-48 hours after the procedure, or as directed by your health care provider. ? Remove the dressing and gently wash the site with plain soap and water. ? Pat the area dry with a clean towel. ? Do not rub the site. That could cause bleeding.  Do not apply powder or lotion to the site. Activity  For 24 hours after the procedure, or as directed by your health care provider: ? Do not flex or bend the affected arm. ? Do not push or pull heavy objects with the affected arm. ? Do not drive  yourself home from the hospital or clinic. You may drive 24 hours after the procedure unless your health care provider tells you not to. ? Do not operate machinery or power tools.  Do not lift anything that is heavier than 10 lb (4.5 kg), or the limit that you are told, until your health care provider says that it is safe.  Ask your health care provider when it is okay to: ? Return to work or school. ? Resume usual physical activities or sports. ? Resume sexual activity.   General instructions  If the catheter site starts to bleed, raise your arm and put firm pressure on the site. If the bleeding does not stop, get help right away. This is a medical emergency.  If you went home on the same day as your procedure, a responsible adult should be with you for the first 24 hours after you arrive home.  Keep all follow-up visits as told by your health care provider. This is important. Contact a health care provider if:  You have a fever.  You have redness, swelling, or yellow drainage around your insertion site. Get help right away if:  You have unusual pain at the radial site.  The catheter insertion area swells very fast.  The insertion area is bleeding, and the bleeding does not stop when you hold steady pressure on the area.  Your arm or hand becomes pale, cool, tingly, or numb. These symptoms may represent a serious   problem that is an emergency. Do not wait to see if the symptoms will go away. Get medical help right away. Call your local emergency services (911 in the [place] Do not drive yourself to the hospital. Summary  After the procedure, it is common to have bruising and tenderness at the site.  Follow instructions from your health care provider about how to take care of your radial site wound. Check the wound every day for signs of infection.  Do not lift anything that is heavier than 10 lb (4.5 kg), or the limit that you are told, until your health care provider says that it  is safe. This information is not intended to replace advice given to you by your health care provider. Make sure you discuss any questions you have with your health care provider. Document Revised: 03/29/2017 Document Reviewed: 03/29/2017 Elsevier Patient Education  2021 Elsevier Inc.  

## 2020-06-05 NOTE — Interval H&P Note (Signed)
History and Physical Interval Note:  06/05/2020 7:42 AM  Ronnie Hardy  has presented today for surgery, with the diagnosis of cp.  The various methods of treatment have been discussed with the patient and family. After consideration of risks, benefits and other options for treatment, the patient has consented to  Procedure(s): LEFT HEART CATH AND CORONARY ANGIOGRAPHY (N/A) as a surgical intervention.  The patient's history has been reviewed, patient examined, no change in status, stable for surgery.  I have reviewed the patient's chart and labs.  Questions were answered to the patient's satisfaction.    Cath Lab Visit (complete for each Cath Lab visit)  Clinical Evaluation Leading to the Procedure:   ACS: No.  Non-ACS:    Anginal Classification: CCS II  Anti-ischemic medical therapy: No Therapy  Non-Invasive Test Results: No non-invasive testing performed  Prior CABG: No previous CABG        Verne Carrow

## 2020-06-30 ENCOUNTER — Other Ambulatory Visit: Payer: Self-pay | Admitting: Orthopedic Surgery

## 2020-06-30 DIAGNOSIS — M545 Low back pain, unspecified: Secondary | ICD-10-CM

## 2020-07-01 ENCOUNTER — Encounter: Payer: Self-pay | Admitting: Cardiology

## 2020-07-01 ENCOUNTER — Encounter (HOSPITAL_COMMUNITY): Payer: Self-pay | Admitting: Cardiovascular Disease

## 2020-07-01 ENCOUNTER — Other Ambulatory Visit (HOSPITAL_COMMUNITY): Payer: 59

## 2020-07-01 NOTE — Progress Notes (Unsigned)
Patient ID: Ronnie Hardy, male   DOB: 1945/09/10, 75 y.o.   MRN: [id number]  Verified appointment "no show" status with Mollie at 3:56pm.

## 2020-07-09 ENCOUNTER — Encounter: Payer: Self-pay | Admitting: Cardiovascular Disease

## 2020-07-09 NOTE — Progress Notes (Signed)
Ronnie Hardy Date of Birth  October 18, 1945       [facility] Office [address]  [address] [place], [place]  [address]   [place], [place]  [address] [phone]     [phone]   Fax  [phone]    Fax [phone]  Problem List: 1. Hypertension 2. Asthma 3. Coronary artery calcifications   01/07/2013 Ronnie Hardy is a 75 yo with hx of HTN.  Avoids salty or fatty foods.  Works out daily .    He has had HTN for the past 5 years or so.   He has a family hx of heart disease ( mother died of stroke, father died of MI - smoker)   He has throbbing in his throat and then knows that his BP is elevated.   He has not been diagnosed with sleep apnea but admits that he doesn't sleep well.    Feb. 4, 2014: BP has been 130s / 80s.  He was started on Horizant but it seems to be causing some peripheral edema.   Ronnie Hardy is seen back for evaluation of coronary artery calcifications . In March he developed a bid chest cold.   Was still short of breath.   CXR showed some calcifications on the aorta ( and ? Coronary )    A coronary calcium score was 390 which is in the 68% of matched subjects   Was started on a statin and ASA by Dr. Renne Crigler.   Works out 5 times a week. No angina with work outs .   Has some  He goes mountain biking on occasion. Has some dyspnea recently which seems to resolve as he continues to cycle.  The tightness seems to be worse in the afternoon and evening.    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  Had a heart cath by Dr. Lorenda Hatchet 2003.  Cath was normal.     August 24, 2016   Ronnie Hardy is seen today  Had a myoview on Jul 14, 2016 that showed no ischemia and normal LV function Cornaary calcium score was 390 .  No recent chest pain .  No dyspnea   Aug. 20, 2019:  Ronnie Hardy is seen today for eval of his contiued shortness of breath .  Has a hx of asthma. Saw pulmonary last week  Spirometry was normal in the  office Denies any chest pain  Has had dyspnea for 7-8 weeks,   Had a chest cold several weeks ago and this shortness of breath has lingered.  Tightness with inspiration .   Feels like his asthma  Has had 2 steroid dose packs which helped a little  Still able to go to the gym without any chest pain or dyspnea     Oct. 12, 2020   Ronnie Hardy is seen today for follow up visit for his coronary artery calcifications and hyperlipidemia He has had a negative myoview in 2018.  He is back at work now. Has developed a LBBB since I I last saw him   Is working out. Feels well, no CP or dyspnea.    February 22, 2019:  Ronnie Hardy is seen today for follow-up visit.  He has known coronary artery calcifications.  We recently did coronary CT angiogram.    Coarsened coronary calcium score is 507 which is 68th percentile for age/sex matched controls.    The CT angiogram reveals mild plaque in the  left anterior descending artery with moderate plaque in the mid LAD.  The left circumflex artery is small and nondominant and   is free of disease.  The right coronary artery is large and dominant and has mild plaque in the distal RCA associated with a 25 to 49% stenosis.  Takes crestor 5 mg 2-3 times a week.  Getting labs today .  Very active    June 02, 2020: Ronnie Hardy is seen today for follow up of his CAD Cor CTA shows : coronary calcium score is 507 which is 68th percentile for age/sex matched controls.    The CT angiogram reveals mild plaque in the left anterior descending artery with moderate plaque in the mid LAD.  The left circumflex artery is small and nondominant and   is free of disease.  The right coronary artery is large and dominant and has mild plaque in the distal RCA associated with a 25 to 49% stenosis We have taken an aggressive approach to his lipids   Has had more shortness of breath recently + chest tightness - especially at night ,  The tightness was new and different which prompted him to call for an  appt.  he has a long hx of asthma so he was unsure if this was an actual cardiac issue vs. Asthma.   covid last year ( likely delta variant)  He had a heart cath years ago ( while training for [employer] )   Jul 10, 2020: Ronnie Hardy was seen for symptoms c/w UAP during his last visit Cath revealed mild - moderate CAD  EF 50-55% No further angina  Is having some lower back , right leg / buttocks pain  Has seen PT  Still working out   LDL has been 44   Current Outpatient Medications on File Prior to Visit  Medication Sig Dispense Refill  . albuterol (PROVENTIL) (2.5 MG/3ML) 0.083% nebulizer solution Take 3 mLs (2.5 mg total) by nebulization every 6 (six) hours as needed for wheezing or shortness of breath. 75 mL 2  . B Complex Vitamins (VITAMIN-B COMPLEX) TABS Take by mouth as directed.     . budesonide-formoterol (SYMBICORT) 160-4.5 MCG/ACT inhaler Inhale 2 puffs into the lungs as needed.    . Eszopiclone 3 MG TABS Take 3 mg by mouth at bedtime. Take immediately before bedtime    . famotidine (PEPCID) 10 MG tablet Take 10 mg by mouth at bedtime as needed. For heartburn    . MAGNESIUM PO Take 1 tablet by mouth daily.    . Multiple Vitamin (MULTI-VITAMINS) TABS Take 1 tablet by mouth daily.    Marland Kitchen olmesartan (BENICAR) 5 MG tablet Take 5 mg by mouth daily.    . Omega-3 1000 MG CAPS Take 1 capsule by mouth daily.     . rosuvastatin (CRESTOR) 5 MG tablet Take 1 tablet (5 mg total) by mouth 3 (three) times a week. 45 tablet 3  . sildenafil (REVATIO) 20 MG tablet Take 1 tablet by mouth as needed.    . tamsulosin (FLOMAX) 0.4 MG CAPS capsule Take 1 capsule by mouth as needed.     No current facility-administered medications on file prior to visit.    Allergies  Allergen Reactions  . Meperidine Nausea And Vomiting  . No Known Allergies     Has some intolerances  . Anesthetics, Amide Nausea Only    Surgical anesthetic    Past Medical History:  Diagnosis Date  . Arthritis     "  all over"  . Asthma   . GERD (gastroesophageal reflux disease)   . Hiatal hernia   . Hypertension   . IBS (irritable bowel syndrome)   . PONV (postoperative nausea and vomiting)   . Postoperative ileus (HCC) 05/22/2014   S/P back OR 05/19/2014    Past Surgical History:  Procedure Laterality Date  . ACHILLES TENDON REPAIR Left 1990's  . BACK SURGERY    . ELBOW SURGERY Bilateral 2000's   "scoped them and took out bone chips"  . EYE SURGERY Left 1980's   "removed splinters"  . LEFT HEART CATH AND CORONARY ANGIOGRAPHY N/A 06/05/2020   Procedure: LEFT HEART CATH AND CORONARY ANGIOGRAPHY;  Surgeon: Kathleene Hazel, MD;  Location: [facility] INVASIVE CV LAB;  Service: Cardiovascular;  Laterality: N/A;  . POSTERIOR FUSION LUMBAR SPINE, MINIMALLY INVASIVE  04/20/2014  . ROTATOR CUFF REPAIR Left 2002  . SHOULDER ARTHROSCOPY W/ ROTATOR CUFF REPAIR Right 2011  . SHOULDER OPEN ROTATOR CUFF REPAIR Left 1990's; 2000's  . TONSILLECTOMY  1954    Social History   Tobacco Use  Smoking Status Passive Smoke Exposure - Never Smoker  Smokeless Tobacco Never Used  Tobacco Comment   Father & Mother when he was a child.     Social History   Substance and Sexual Activity  Alcohol Use Yes   Comment: 05/22/2014 "a drink q 2 wk or so"    Family History  Problem Relation Age of Onset  . Heart disease Mother   . Stroke Mother   . Brain cancer Mother   . Heart disease Father   . Alzheimer's disease Brother   . Asthma Brother   . Asthma Daughter     Reviw of Systems:  Reviewed in the HPI.  All other systems are negative.  Physical Exam: Blood pressure 122/78, pulse (!) 54, height 5' 10.5" (1.791 m), weight 172 lb (78 kg), SpO2 98 %.  GEN:  Well nourished, well developed in no acute distress HEENT: Normal NECK: No JVD; No carotid bruits LYMPHATICS: No lymphadenopathy CARDIAC: RRR , no murmurs, rubs, gallops RESPIRATORY:  Clear to auscultation without rales, wheezing or rhonchi  ABDOMEN:  Soft, non-tender, non-distended MUSCULOSKELETAL:  No edema; No deformity  SKIN: Warm and dry NEUROLOGIC:  Alert and oriented x 3    ECG:  .    Assessment / Plan:   1.  Unstable angina:   Cath showed only mild CAD .   No clear explanation for his ECG changes or symptoms  Cont rosuvastatin     2.  LBBB :   - stable      2. Hypertension:   BP Is well contolled.   Cont meds.       Kristeen Miss, MD  07/10/2020 1:47 PM    [facility] [address] [place],  [address] [place], [place]  [address] Pager [phone] Phone: [phone]; Fax: [phone]

## 2020-07-10 ENCOUNTER — Encounter: Payer: Self-pay | Admitting: Cardiovascular Disease

## 2020-07-10 ENCOUNTER — Ambulatory Visit (INDEPENDENT_AMBULATORY_CARE_PROVIDER_SITE_OTHER): Payer: 59 | Admitting: Cardiovascular Disease

## 2020-07-10 ENCOUNTER — Other Ambulatory Visit: Payer: Self-pay

## 2020-07-10 VITALS — BP 122/78 | HR 54 | Ht 70.5 in | Wt 172.0 lb

## 2020-07-10 DIAGNOSIS — I2510 Atherosclerotic heart disease of native coronary artery without angina pectoris: Secondary | ICD-10-CM | POA: Diagnosis not present

## 2020-07-10 DIAGNOSIS — I447 Left bundle-branch block, unspecified: Secondary | ICD-10-CM

## 2020-07-10 DIAGNOSIS — I2584 Coronary atherosclerosis due to calcified coronary lesion: Secondary | ICD-10-CM

## 2020-07-10 DIAGNOSIS — I10 Essential (primary) hypertension: Secondary | ICD-10-CM | POA: Diagnosis not present

## 2020-07-10 NOTE — Patient Instructions (Signed)

## 2020-07-15 ENCOUNTER — Telehealth (HOSPITAL_COMMUNITY): Payer: Self-pay | Admitting: Cardiovascular Disease

## 2020-07-15 NOTE — Telephone Encounter (Signed)
Just an FYI. We have made several attempts to contact this patient including sending a letter to schedule or reschedule their echocardiogram. We will be removing the patient from the echo WQ.    07/01/20 NO SHOWED -MAILED LETTER [facility]  Thank you 

## 2020-07-20 ENCOUNTER — Ambulatory Visit
Admission: RE | Admit: 2020-07-20 | Discharge: 2020-07-20 | Disposition: A | Discharge status: Home / Self Care | Payer: 59 | Source: Ambulatory Visit | Attending: Orthopedic Surgery | Admitting: Orthopedic Surgery

## 2020-07-20 ENCOUNTER — Other Ambulatory Visit: Payer: Self-pay

## 2020-07-20 DIAGNOSIS — M545 Low back pain, unspecified: Secondary | ICD-10-CM

## 2020-08-05 ENCOUNTER — Telehealth: Payer: Self-pay | Admitting: Acute Care

## 2020-08-05 NOTE — Telephone Encounter (Signed)
Pt stated that he feels that he does not need the CT scan done.  The last CT scan was done 09/13/2019.  SG please advise. Thanks

## 2020-08-05 NOTE — Telephone Encounter (Signed)
Recommendation was for 12 month follow up.  It is his choice to defer.  Angelique Blonder, please call the patient to discuss. If he defers, we need to document that this is his own choice and it is made against medical advice.  See when he will agree to repeat screening.  Thanks so much

## 2020-08-06 NOTE — Telephone Encounter (Signed)
LMTC x 1 for pt. Need to discuss need for f/u Ct and appt with Dr Wynona Neat.

## 2020-08-06 NOTE — Telephone Encounter (Signed)
This is not a Lung Cancer Screening Program patient.

## 2020-08-12 ENCOUNTER — Encounter: Payer: Self-pay | Admitting: *Deleted

## 2020-08-12 NOTE — Telephone Encounter (Signed)
Letter mailed to pt asking him to contact our office to discuss scheduling of CT and f/u appt with Dr Wynona Neat.

## 2020-09-03 ENCOUNTER — Other Ambulatory Visit: Payer: Self-pay | Admitting: Orthopedic Surgery

## 2020-09-03 DIAGNOSIS — M546 Pain in thoracic spine: Secondary | ICD-10-CM

## 2020-09-04 ENCOUNTER — Other Ambulatory Visit: Payer: Self-pay | Admitting: Orthopedic Surgery

## 2020-09-04 DIAGNOSIS — M545 Low back pain, unspecified: Secondary | ICD-10-CM

## 2020-09-04 DIAGNOSIS — G8929 Other chronic pain: Secondary | ICD-10-CM

## 2020-09-14 ENCOUNTER — Other Ambulatory Visit: Payer: Self-pay

## 2020-09-14 ENCOUNTER — Ambulatory Visit
Admission: RE | Admit: 2020-09-14 | Discharge: 2020-09-14 | Disposition: A | Discharge status: Home / Self Care | Payer: 59 | Source: Ambulatory Visit | Attending: Orthopedic Surgery | Admitting: Orthopedic Surgery

## 2020-09-14 DIAGNOSIS — M546 Pain in thoracic spine: Secondary | ICD-10-CM

## 2020-09-23 ENCOUNTER — Ambulatory Visit
Admission: RE | Admit: 2020-09-23 | Discharge: 2020-09-23 | Disposition: A | Discharge status: Home / Self Care | Payer: 59 | Source: Ambulatory Visit | Attending: Orthopedic Surgery | Admitting: Orthopedic Surgery

## 2020-09-23 ENCOUNTER — Other Ambulatory Visit: Payer: Self-pay

## 2020-09-23 ENCOUNTER — Other Ambulatory Visit: Payer: Self-pay | Admitting: Internal Medicine

## 2020-09-23 DIAGNOSIS — G8929 Other chronic pain: Secondary | ICD-10-CM

## 2020-09-23 DIAGNOSIS — M545 Low back pain, unspecified: Secondary | ICD-10-CM

## 2020-09-23 DIAGNOSIS — R918 Other nonspecific abnormal finding of lung field: Secondary | ICD-10-CM

## 2020-10-05 ENCOUNTER — Other Ambulatory Visit: Payer: Self-pay | Admitting: Orthopedic Surgery

## 2020-10-05 DIAGNOSIS — G8929 Other chronic pain: Secondary | ICD-10-CM

## 2020-10-22 ENCOUNTER — Other Ambulatory Visit: Payer: Self-pay

## 2020-10-22 ENCOUNTER — Ambulatory Visit
Admission: RE | Admit: 2020-10-22 | Discharge: 2020-10-22 | Disposition: A | Discharge status: Home / Self Care | Payer: 59 | Source: Ambulatory Visit | Attending: Orthopedic Surgery | Admitting: Orthopedic Surgery

## 2020-10-22 DIAGNOSIS — G8929 Other chronic pain: Secondary | ICD-10-CM

## 2020-10-22 DIAGNOSIS — M533 Sacrococcygeal disorders, not elsewhere classified: Secondary | ICD-10-CM

## 2020-10-22 MED ORDER — METHYLPREDNISOLONE ACETATE 40 MG/ML INJ SUSP (RADIOLOG
80.0000 mg | Freq: Once | INTRAMUSCULAR | Status: AC
  Administered 2020-10-22: 80 mg via INTRA_ARTICULAR

## 2020-10-28 ENCOUNTER — Ambulatory Visit
Admission: RE | Admit: 2020-10-28 | Discharge: 2020-10-28 | Disposition: A | Discharge status: Home / Self Care | Payer: 59 | Source: Ambulatory Visit | Attending: Internal Medicine | Admitting: Internal Medicine

## 2020-10-28 DIAGNOSIS — R918 Other nonspecific abnormal finding of lung field: Secondary | ICD-10-CM

## 2020-12-10 ENCOUNTER — Telehealth: Payer: Self-pay | Admitting: *Deleted

## 2020-12-10 NOTE — Telephone Encounter (Signed)
   Name: Ronnie Hardy  DOB: October 03, 1945  MRN: [id number]   Primary Cardiologist: Kristeen Miss, MD  Chart reviewed as part of pre-operative protocol coverage. Patient was contacted 12/10/2020 in reference to pre-operative risk assessment for pending surgery as outlined below.  Ronnie Hardy was last seen on 07/2020 by Dr. Elease Hashimoto, chart reviewed. Primary history includes nobobstructive CAD, HTN, LBBB. Cardiac cath 06/2020 showed mild nonobstructive CAD. EF was 50-55% by LV gram at that time. If including history of CAD in RCRI, his revised cardiac risk index is 0.9% which is still generally low risk for CV complications. Tried to reach pt but got VM. LMTCB. No meds to hold listed on MAR at this time.  Laurann Montana, PA-C 12/10/2020, 1:39 PM

## 2020-12-10 NOTE — Telephone Encounter (Signed)
   [facility] Pre-operative Risk Assessment    Patient Name: Ronnie Hardy  DOB: [birth year] MRN: [id number]  HEARTCARE STAFF:  - IMPORTANT!!!!!! Under Visit Info/Reason for Call, type in Other and utilize the format Clearance MM/DD/YY or Clearance TBD. Do not use dashes or single digits. - Please review there is not already an duplicate clearance open for this procedure. - If request is for dental extraction, please clarify the # of teeth to be extracted. - If the patient is currently at the dentist's office, call Pre-Op Callback Staff (MA/nurse) to input urgent request.  - If the patient is not currently in the dentist office, please route to the Pre-Op pool.  Request for surgical clearance:  What type of surgery is being performed? RIGHT SACROILIAC JOINT FUSION  When is this surgery scheduled? 01/05/21  What type of clearance is required (medical clearance vs. Pharmacy clearance to hold med vs. Both)? MEDICAL  Are there any medications that need to be held prior to surgery and how long?  NONE LISTED  Practice name and name of physician performing surgery? [facility]; DR. MARK DUMONSKI  What is the office phone number? [phone]   7.   What is the office fax number? [place]  8.   Anesthesia type (None, local, MAC, general) ? NOT LISTED (CHOICE)   Julaine Hua 12/10/2020, 12:43 PM  _________________________________________________________________   (provider comments below)

## 2020-12-15 NOTE — Telephone Encounter (Signed)
Left message for the patient to call back and speak with on call preop APP

## 2020-12-16 NOTE — Telephone Encounter (Signed)
   Name: Ronnie Hardy  DOB: 19-Jan-1946  MRN: [id number]   Primary Cardiologist: Kristeen Miss, MD  Chart reviewed as part of pre-operative protocol coverage. Patient was contacted 12/16/2020 in reference to pre-operative risk assessment for pending surgery as outlined below.  Torie Priebe was last seen on 07/10/2020 by Dr. Elease Hashimoto.  Since that day, Kennard Fildes has done well without any exertional chest pain or worsening dyspnea.  Last cardiac catheterization was performed on 06/05/2020 showed mild coronary artery disease.  Therefore, based on ACC/AHA guidelines, the patient would be at acceptable risk for the planned procedure without further cardiovascular testing.   The patient was advised that if he develops new symptoms prior to surgery to contact our office to arrange for a follow-up visit, and he verbalized understanding.  I will route this recommendation to the requesting party via Epic fax function and remove from pre-op pool. Please call with questions.  Azalee Course, [place] 12/16/2020, 10:36 AM

## 2021-07-16 ENCOUNTER — Other Ambulatory Visit: Payer: Self-pay | Admitting: Orthopedic Surgery

## 2021-07-16 DIAGNOSIS — M545 Low back pain, unspecified: Secondary | ICD-10-CM

## 2021-08-03 ENCOUNTER — Ambulatory Visit
Admission: RE | Admit: 2021-08-03 | Discharge: 2021-08-03 | Disposition: A | Discharge status: Home / Self Care | Payer: 59 | Source: Ambulatory Visit | Attending: Orthopedic Surgery | Admitting: Orthopedic Surgery

## 2021-08-03 DIAGNOSIS — M545 Low back pain, unspecified: Secondary | ICD-10-CM

## 2021-08-03 MED ORDER — IOPAMIDOL (ISOVUE-M 200) INJECTION 41%
1.0000 mL | Freq: Once | INTRAMUSCULAR | Status: AC
  Administered 2021-08-03: 1 mL via EPIDURAL

## 2021-08-03 MED ORDER — METHYLPREDNISOLONE ACETATE 40 MG/ML INJ SUSP (RADIOLOG
80.0000 mg | Freq: Once | INTRAMUSCULAR | Status: AC
  Administered 2021-08-03: 80 mg via EPIDURAL

## 2021-08-03 NOTE — Discharge Instructions (Signed)

## 2021-10-03 ENCOUNTER — Encounter: Payer: Self-pay | Admitting: Cardiovascular Disease

## 2021-10-03 NOTE — Progress Notes (Unsigned)
Ronnie Hardy Date of Birth  October 18, 1945       [facility] Office [address]  [address], [address] [place], [place]  [address]   [place], [place]  [address] [phone]     [phone]   Fax  [phone]    Fax [phone]  Problem List: 1. Hypertension 2. Asthma 3. Coronary artery calcifications   01/07/2013 Ronnie Hardy is a 76 yo with hx of HTN.  Avoids salty or fatty foods.  Works out daily .    He has had HTN for the past 5 years or so.   He has a family hx of heart disease ( mother died of stroke, father died of MI - smoker)   He has throbbing in his throat and then knows that his BP is elevated.   He has not been diagnosed with sleep apnea but admits that he doesn't sleep well.    Feb. 4, 2014: BP has been 130s / 80s.  He was started on Horizant but it seems to be causing some peripheral edema.   Ronnie Hardy is seen back for evaluation of coronary artery calcifications . In March he developed a bid chest cold.   Was still short of breath.   CXR showed some calcifications on the aorta ( and ? Coronary )    A coronary calcium score was 390 which is in the 68% of matched subjects   Was started on a statin and ASA by Dr. Renne Crigler.   Works out 5 times a week. No angina with work outs .   Has some  He goes mountain biking on occasion. Has some dyspnea recently which seems to resolve as he continues to cycle.  The tightness seems to be worse in the afternoon and evening.    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  Had a heart cath by Dr. Lorenda Hatchet 2003.  Cath was normal.     August 24, 2016   Ronnie Hardy is seen today  Had a myoview on Jul 14, 2016 that showed no ischemia and normal LV function Cornaary calcium score was 390 .  No recent chest pain .  No dyspnea   Aug. 20, 2019:  Ronnie Hardy is seen today for eval of his contiued shortness of breath .  Has a hx of asthma. Saw pulmonary last week  Spirometry was normal in the  office Denies any chest pain  Has had dyspnea for 7-8 weeks,   Had a chest cold several weeks ago and this shortness of breath has lingered.  Tightness with inspiration .   Feels like his asthma  Has had 2 steroid dose packs which helped a little  Still able to go to the gym without any chest pain or dyspnea     Oct. 12, 2020   Ronnie Hardy is seen today for follow up visit for his coronary artery calcifications and hyperlipidemia He has had a negative myoview in 2018.  He is back at work now. Has developed a LBBB since I I last saw him   Is working out. Feels well, no CP or dyspnea.    February 22, 2019:  Ronnie Hardy is seen today for follow-up visit.  He has known coronary artery calcifications.  We recently did coronary CT angiogram.    Coarsened coronary calcium score is 507 which is 68th percentile for age/sex matched controls.    The CT angiogram reveals mild plaque in the  left anterior descending artery with moderate plaque in the mid LAD.  The left circumflex artery is small and nondominant and   is free of disease.  The right coronary artery is large and dominant and has mild plaque in the distal RCA associated with a 25 to 49% stenosis.  Takes crestor 5 mg 2-3 times a week.  Getting labs today .  Very active    June 02, 2020: Ronnie Hardy is seen today for follow up of his CAD Cor CTA shows : coronary calcium score is 507 which is 68th percentile for age/sex matched controls.    The CT angiogram reveals mild plaque in the left anterior descending artery with moderate plaque in the mid LAD.  The left circumflex artery is small and nondominant and   is free of disease.  The right coronary artery is large and dominant and has mild plaque in the distal RCA associated with a 25 to 49% stenosis We have taken an aggressive approach to his lipids   Has had more shortness of breath recently + chest tightness - especially at night ,  The tightness was new and different which prompted him to call for an  appt.  he has a long hx of asthma so he was unsure if this was an actual cardiac issue vs. Asthma.   covid last year ( likely delta variant)  He had a heart cath years ago ( while training for [employer] )   Jul 10, 2020: Ronnie Hardy was seen for symptoms c/w UAP during his last visit Cath revealed mild - moderate CAD  EF 50-55% No further angina  Is having some lower back , right leg / buttocks pain  Has seen PT  Still working out   LDL has been 44   October 04, 2021 Ronnie Hardy is seen for follow up of his Coronary artery calcifications Has had symptoms of UPA Cath revealed mild - mod CAE  Still working out regularly    Current Outpatient Medications on File Prior to Visit  Medication Sig Dispense Refill   albuterol (PROVENTIL) (2.5 MG/3ML) 0.083% nebulizer solution Take 3 mLs (2.5 mg total) by nebulization every 6 (six) hours as needed for wheezing or shortness of breath. 75 mL 2   B Complex Vitamins (VITAMIN-B COMPLEX) TABS Take by mouth as directed.      budesonide-formoterol (SYMBICORT) 160-4.5 MCG/ACT inhaler Inhale 2 puffs into the lungs as needed.     Eszopiclone 3 MG TABS Take 3 mg by mouth at bedtime. Take immediately before bedtime     famotidine (PEPCID) 10 MG tablet Take 10 mg by mouth at bedtime as needed. For heartburn     MAGNESIUM PO Take 1 tablet by mouth daily.     Multiple Vitamin (MULTI-VITAMINS) TABS Take 1 tablet by mouth daily.     olmesartan (BENICAR) 5 MG tablet Take 5 mg by mouth daily.     Omega-3 1000 MG CAPS Take 1 capsule by mouth daily.      rosuvastatin (CRESTOR) 5 MG tablet Take 1 tablet (5 mg total) by mouth 3 (three) times a week. 45 tablet 3   sildenafil (REVATIO) 20 MG tablet Take 1 tablet by mouth as needed.     tamsulosin (FLOMAX) 0.4 MG CAPS capsule Take 1 capsule by mouth as needed.     No current facility-administered medications on file prior to visit.    Allergies  Allergen Reactions   Meperidine Nausea And Vomiting   No Known  Allergies  Has some intolerances   Anesthetics, Amide Nausea Only    Surgical anesthetic    Past Medical History:  Diagnosis Date   Arthritis    "all over"   Asthma    GERD (gastroesophageal reflux disease)    Hiatal hernia    Hypertension    IBS (irritable bowel syndrome)    PONV (postoperative nausea and vomiting)    Postoperative ileus (HCC) 05/22/2014   S/P back OR 05/19/2014    Past Surgical History:  Procedure Laterality Date   ACHILLES TENDON REPAIR Left 1990's   BACK SURGERY     ELBOW SURGERY Bilateral 2000's   "scoped them and took out bone chips"   EYE SURGERY Left 1980's   "removed splinters"   LEFT HEART CATH AND CORONARY ANGIOGRAPHY N/A 06/05/2020   Procedure: LEFT HEART CATH AND CORONARY ANGIOGRAPHY;  Surgeon: Kathleene Hazel, MD;  Location: [facility] INVASIVE [facility] LAB;  Service: Cardiovascular;  Laterality: N/A;   POSTERIOR FUSION LUMBAR SPINE, MINIMALLY INVASIVE  04/20/2014   ROTATOR CUFF REPAIR Left 2002   SHOULDER ARTHROSCOPY W/ ROTATOR CUFF REPAIR Right 2011   SHOULDER OPEN ROTATOR CUFF REPAIR Left 1990's; 2000's   TONSILLECTOMY  1954    Social History   Tobacco Use  Smoking Status Passive Smoke Exposure - Never Smoker  Smokeless Tobacco Never  Tobacco Comments   Father & Mother when he was a child.     Social History   Substance and Sexual Activity  Alcohol Use Yes   Comment: 05/22/2014 "a drink q 2 wk or so"    Family History  Problem Relation Age of Onset   Heart disease Mother    Stroke Mother    Brain cancer Mother    Heart disease Father    Alzheimer's disease Brother    Asthma Brother    Asthma Daughter     Reviw of Systems:  Reviewed in the HPI.  All other systems are negative.  Physical Exam: There were no vitals taken for this visit.  GEN:  Well nourished, well developed in no acute distress HEENT: Normal NECK: No JVD; No carotid bruits LYMPHATICS: No lymphadenopathy CARDIAC: RRR ***, no murmurs, rubs,  gallops RESPIRATORY:  Clear to auscultation without rales, wheezing or rhonchi  ABDOMEN: Soft, non-tender, non-distended MUSCULOSKELETAL:  No edema; No deformity  SKIN: Warm and dry NEUROLOGIC:  Alert and oriented x 3     ECG:  .    Assessment / Plan:   1.  Unstable angina:       2.  LBBB :   -     2. Hypertension:        Kristeen Miss, MD  10/03/2021 6:56 PM    [facility] [address] [place],  [address] [place], [place]  [address] Pager [phone] Phone: [phone]; Fax: [phone]

## 2021-10-04 ENCOUNTER — Encounter: Payer: Self-pay | Admitting: Cardiovascular Disease

## 2021-10-04 ENCOUNTER — Ambulatory Visit (INDEPENDENT_AMBULATORY_CARE_PROVIDER_SITE_OTHER): Payer: 59 | Admitting: Cardiovascular Disease

## 2021-10-04 VITALS — BP 122/76 | HR 57 | Resp 20 | Ht 70.0 in | Wt 170.4 lb

## 2021-10-04 DIAGNOSIS — I10 Essential (primary) hypertension: Secondary | ICD-10-CM | POA: Diagnosis not present

## 2021-10-04 DIAGNOSIS — I447 Left bundle-branch block, unspecified: Secondary | ICD-10-CM | POA: Diagnosis not present

## 2021-10-04 DIAGNOSIS — I2510 Atherosclerotic heart disease of native coronary artery without angina pectoris: Secondary | ICD-10-CM | POA: Diagnosis not present

## 2021-10-04 DIAGNOSIS — I2584 Coronary atherosclerosis due to calcified coronary lesion: Secondary | ICD-10-CM

## 2021-10-04 NOTE — Patient Instructions (Signed)
Medication Instructions:  NO CHANGES *If you need a refill on your cardiac medications before your next appointment, please call your pharmacy*   Lab Work: NONE If you have labs (blood work) drawn today and your tests are completely normal, you will receive your results only by: MyChart Message (if you have MyChart) OR A paper copy in the mail If you have any lab test that is abnormal or we need to change your treatment, we will call you to review the results.   Testing/Procedures: Your physician has requested that you have an echocardiogram. Echocardiography is a painless test that uses sound waves to create images of your heart. It provides your doctor with information about the size and shape of your heart and how well your heart's chambers and valves are working. This procedure takes approximately one hour. There are no restrictions for this procedure. DUE IN A YEAR    Follow-Up: At [facility], you and your health needs are our priority.  As part of our continuing mission to provide you with exceptional heart care, we have created designated Provider Care Teams.  These Care Teams include your primary Cardiologist (physician) and Advanced Practice Providers (APPs -  Physician Assistants and Nurse Practitioners) who all work together to provide you with the care you need, when you need it.  We recommend signing up for the patient portal called "MyChart".  Sign up information is provided on this After Visit Summary.  MyChart is used to connect with patients for Virtual Visits (Telemedicine).  Patients are able to view lab/test results, encounter notes, upcoming appointments, etc.  Non-urgent messages can be sent to your provider as well.   To learn more about what you can do with MyChart, go to [email or web address].    Your next appointment:   1 year(s)  The format for your next appointment:   In Person  Provider:   Kristeen Miss, MD     Other Instructions NONE  Important  Information About Sugar

## 2021-10-26 ENCOUNTER — Other Ambulatory Visit: Payer: Self-pay | Admitting: Orthopedic Surgery

## 2021-10-26 DIAGNOSIS — M545 Low back pain, unspecified: Secondary | ICD-10-CM

## 2021-10-26 DIAGNOSIS — M5416 Radiculopathy, lumbar region: Secondary | ICD-10-CM

## 2021-11-12 ENCOUNTER — Other Ambulatory Visit: Payer: 59

## 2022-01-14 ENCOUNTER — Other Ambulatory Visit: Payer: Self-pay | Admitting: Orthopedic Surgery

## 2022-01-14 DIAGNOSIS — M545 Low back pain, unspecified: Secondary | ICD-10-CM

## 2022-02-14 ENCOUNTER — Other Ambulatory Visit: Payer: Self-pay | Admitting: Orthopedic Surgery

## 2022-02-14 DIAGNOSIS — M545 Low back pain, unspecified: Secondary | ICD-10-CM

## 2022-03-03 ENCOUNTER — Ambulatory Visit
Admission: RE | Admit: 2022-03-03 | Discharge: 2022-03-03 | Disposition: A | Discharge status: Home / Self Care | Payer: 59 | Source: Ambulatory Visit | Attending: Orthopedic Surgery | Admitting: Orthopedic Surgery

## 2022-03-03 DIAGNOSIS — M545 Low back pain, unspecified: Secondary | ICD-10-CM

## 2022-03-03 MED ORDER — IOPAMIDOL (ISOVUE-M 200) INJECTION 41%
1.0000 mL | Freq: Once | INTRAMUSCULAR | Status: AC
  Administered 2022-03-03: 1 mL via EPIDURAL

## 2022-03-03 MED ORDER — METHYLPREDNISOLONE ACETATE 40 MG/ML INJ SUSP (RADIOLOG
80.0000 mg | Freq: Once | INTRAMUSCULAR | Status: AC
  Administered 2022-03-03: 80 mg via EPIDURAL

## 2022-03-03 NOTE — Discharge Instructions (Signed)

## 2022-03-04 ENCOUNTER — Inpatient Hospital Stay: Admission: RE | Admit: 2022-03-04 | Payer: 59 | Source: Ambulatory Visit

## 2022-05-13 DIAGNOSIS — M533 Sacrococcygeal disorders, not elsewhere classified: Secondary | ICD-10-CM | POA: Diagnosis not present

## 2022-05-17 ENCOUNTER — Other Ambulatory Visit: Payer: Self-pay | Admitting: Orthopedic Surgery

## 2022-05-17 DIAGNOSIS — M5459 Other low back pain: Secondary | ICD-10-CM

## 2022-06-15 ENCOUNTER — Ambulatory Visit
Admission: RE | Admit: 2022-06-15 | Discharge: 2022-06-15 | Disposition: A | Discharge status: Home / Self Care | Payer: Medicare Other | Source: Ambulatory Visit | Attending: Orthopedic Surgery | Admitting: Orthopedic Surgery

## 2022-06-15 DIAGNOSIS — M5459 Other low back pain: Secondary | ICD-10-CM

## 2022-06-15 DIAGNOSIS — M533 Sacrococcygeal disorders, not elsewhere classified: Secondary | ICD-10-CM | POA: Diagnosis not present

## 2022-06-15 MED ORDER — METHYLPREDNISOLONE ACETATE 40 MG/ML INJ SUSP (RADIOLOG: 120.0000 mg | Freq: Once | INTRAMUSCULAR | Status: DC

## 2022-07-15 DIAGNOSIS — M533 Sacrococcygeal disorders, not elsewhere classified: Secondary | ICD-10-CM | POA: Diagnosis not present

## 2022-07-27 DIAGNOSIS — R3915 Urgency of urination: Secondary | ICD-10-CM | POA: Diagnosis not present

## 2022-07-27 DIAGNOSIS — R350 Frequency of micturition: Secondary | ICD-10-CM | POA: Diagnosis not present

## 2022-07-28 ENCOUNTER — Other Ambulatory Visit: Payer: Self-pay | Admitting: Urology

## 2022-07-28 DIAGNOSIS — N401 Enlarged prostate with lower urinary tract symptoms: Secondary | ICD-10-CM

## 2022-07-29 NOTE — Progress Notes (Signed)
Chief Complaint: Patient was seen in consultation today for benign prostatic hyperplasia  Referring Physician(s): Wrenn,John  History of Present Illness: Ronnie Hardy is a 77 y.o. male with a medical history significant for HTN, IBS, back pain, erectile dysfunction, elevated PSA and benign prostatic hyperplasia with lower urinary tract symptoms. He's met with his Urologist several times this year for treatment of his LUTS with complaints of urinary urgency, frequency and nocturia. He has had these symptoms for the last several years with some progression and has tried several medications over the years with variable success.  He has had past issues with incomplete bladder emptying and a weak urinary stream. He is currently on tamsulosin and tadalafil daily with PRN use of sildenafil and his current IPSS score is 17. On recent TRUS at the Urology office, his prostate measured 104 ml.  Dr. Annabell Howells discussed multiple treatment options including prostatectomy, HoLEP, Aquablation and PAE.  The patient presents today at the request of Dr. Annabell Howells for possible treatment and management options for prostate hyperplasia with obstruction and lower urinary tract symptoms.  He is mostly interested in pursuing minimally invasive options for BPH over surgical.      Past Medical History:  Diagnosis Date   Arthritis    "all over"   Asthma    GERD (gastroesophageal reflux disease)    Hiatal hernia    Hypertension    IBS (irritable bowel syndrome)    PONV (postoperative nausea and vomiting)    Postoperative ileus (HCC) 05/22/2014   S/P back OR 05/19/2014    Past Surgical History:  Procedure Laterality Date   ACHILLES TENDON REPAIR Left 1990's   BACK SURGERY     ELBOW SURGERY Bilateral 2000's   "scoped them and took out bone chips"   EYE SURGERY Left 1980's   "removed splinters"   LEFT HEART CATH AND CORONARY ANGIOGRAPHY N/A 06/05/2020   Procedure: LEFT HEART CATH AND CORONARY ANGIOGRAPHY;   Surgeon: Kathleene Hazel, MD;  Location: [facility] INVASIVE CV LAB;  Service: Cardiovascular;  Laterality: N/A;   POSTERIOR FUSION LUMBAR SPINE, MINIMALLY INVASIVE  04/20/2014   ROTATOR CUFF REPAIR Left 2002   SHOULDER ARTHROSCOPY W/ ROTATOR CUFF REPAIR Right 2011   SHOULDER OPEN ROTATOR CUFF REPAIR Left 1990's; 2000's   TONSILLECTOMY  1954    Allergies: Meperidine and Anesthetics, amide  Medications: Prior to Admission medications   Medication Sig Start Date End Date Taking? Authorizing Provider  albuterol (PROVENTIL) (2.5 MG/3ML) 0.083% nebulizer solution Take 3 mLs (2.5 mg total) by nebulization every 6 (six) hours as needed for wheezing or shortness of breath. 09/20/17   Bevelyn Ngo, NP  B Complex Vitamins (VITAMIN-B COMPLEX) TABS Take by mouth as directed.     [provider]  budesonide-formoterol (SYMBICORT) 160-4.5 MCG/ACT inhaler Inhale 2 puffs into the lungs as needed.    [provider]  Eszopiclone 3 MG TABS Take 3 mg by mouth at bedtime. Take immediately before bedtime    [provider]  famotidine (PEPCID) 10 MG tablet Take 10 mg by mouth 2 (two) times daily. For heartburn    [provider]  MAGNESIUM PO Take 1 tablet by mouth daily.    [provider]  Multiple Vitamin (MULTI-VITAMINS) TABS Take 1 tablet by mouth daily.    [provider]  olmesartan (BENICAR) 5 MG tablet Take 5 mg by mouth daily.    [provider]  Omega-3 1000 MG CAPS Take 1 capsule by mouth daily.  [provider]  rosuvastatin (CRESTOR) 5 MG tablet Take 1 tablet (5 mg total) by mouth 3 (three) times a week. 10/25/17   Nahser, Deloris Ping, MD  sildenafil (REVATIO) 20 MG tablet Take 1 tablet by mouth as needed.    [provider]  tamsulosin (FLOMAX) 0.4 MG CAPS capsule Take 1 capsule by mouth as needed. 03/17/20   [provider]     Family History  Problem Relation Age of Onset   Heart disease Mother     Stroke Mother    Brain cancer Mother    Heart disease Father    Alzheimer's disease Brother    Asthma Brother    Asthma Daughter     Social History   Socioeconomic History   Marital status: Married    Spouse name: Not on file   Number of children: Not on file   Years of education: Not on file   Highest education level: Not on file  Occupational History   Not on file  Tobacco Use   Smoking status: Never    Passive exposure: Yes   Smokeless tobacco: Never   Tobacco comments:    Father & Mother when he was a child.   Vaping Use   Vaping Use: Never used  Substance and Sexual Activity   Alcohol use: Yes    Comment: 05/22/2014 "a drink q 2 wk or so"   Drug use: No   Sexual activity: Not Currently  Other Topics Concern   Not on file  Social History Narrative   [facility] (07/21/16):   Originally from [facility]. He has mostly lived in [place]. Has traveled to [place] to visit relatives. 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 No bird exposure. Previously had mold [place] a [place] that he [place] previously. Rarely ever uses his hot tub. 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 Also enjoys gardening. Does have probable asbestos exposure.    Social Determinants of Health   Financial Resource Strain: Not on file  Food Insecurity: Not on file  Transportation Needs: Not on file  Physical Activity: Not on file  Stress: Not on file  Social Connections: Not on file     Review of Systems: A 12 point ROS discussed and pertinent positives are indicated in the HPI above.  All other systems are negative.  Vital Signs: There were no vitals taken for this visit.  Advance Care Plan: The advanced care plan/surrogate decision maker was discussed at the time of visit and documented in the medical record.    Physical Exam Constitutional:      General: He is not in acute distress. HENT:     Head: Normocephalic.     Mouth/Throat:      Mouth: Mucous membranes are moist.  Eyes:     General: No scleral icterus. Cardiovascular:     Rate and Rhythm: Normal rate and regular rhythm.  Pulmonary:     Breath sounds: Normal breath sounds.  Abdominal:     General: There is no distension.  Musculoskeletal:        General: No swelling.  Skin:    General: Skin is warm and dry.     Coloration: Skin is not jaundiced.  Neurological:     Mental Status: He is alert and oriented to person, place, and time.     Imaging: No results found.  Labs:  CBC: No results for input(s): "WBC", "HGB", "  HCT", "PLT" in the last 8760 hours.  COAGS: No results for input(s): "INR", "APTT" in the last 8760 hours.  BMP: No results for input(s): "NA", "K", "CL", "CO2", "GLUCOSE", "BUN", "CALCIUM", "CREATININE", "GFRNONAA", "GFRAA" in the last 8760 hours.  Invalid input(s): "CMP"  LIVER FUNCTION TESTS: No results for input(s): "BILITOT", "AST", "ALT", "ALKPHOS", "PROT", "ALBUMIN" in the last 8760 hours.  TUMOR MARKERS: No results for input(s): "AFPTM", "CEA", "CA199", "CHROMGRNA" in the last 8760 hours.  Assessment and Plan: Ronnie Hardy is a 77 y.o. male with a medical history significant for benign prostatic hyperplasia with lower urinary tract symptoms (IPSS/QoL 12/4) who would be an excellent candidate for prostate artery embolization.  He wishes to proceed.  -obtain CTA pelvis for planning purposes -plan for prostate artery embol[facility] at [facility]  Marliss Coots, MD Pager: [phone]    I spent a total of  60 Minutes   in face to face in clinical consultation, greater than 50% of which was counseling/coordinating care for benign prostatic hyperplasia

## 2022-08-02 ENCOUNTER — Other Ambulatory Visit: Payer: Self-pay | Admitting: Interventional Radiology

## 2022-08-02 ENCOUNTER — Ambulatory Visit
Admission: RE | Admit: 2022-08-02 | Discharge: 2022-08-02 | Disposition: A | Discharge status: Home / Self Care | Payer: Medicare Other | Source: Ambulatory Visit | Attending: Urology | Admitting: Urology

## 2022-08-02 DIAGNOSIS — N401 Enlarged prostate with lower urinary tract symptoms: Secondary | ICD-10-CM

## 2022-08-02 HISTORY — PX: IR RADIOLOGIST EVAL & MGMT: IMG5224

## 2022-08-03 ENCOUNTER — Inpatient Hospital Stay: Admission: RE | Admit: 2022-08-03 | Payer: Medicare Other | Source: Ambulatory Visit

## 2022-08-11 DIAGNOSIS — Z0000 Encounter for general adult medical examination without abnormal findings: Secondary | ICD-10-CM | POA: Diagnosis not present

## 2022-08-16 ENCOUNTER — Ambulatory Visit
Admission: RE | Admit: 2022-08-16 | Discharge: 2022-08-16 | Disposition: A | Discharge status: Home / Self Care | Payer: Medicare Other | Source: Ambulatory Visit | Attending: Interventional Radiology | Admitting: Interventional Radiology

## 2022-08-16 DIAGNOSIS — R918 Other nonspecific abnormal finding of lung field: Secondary | ICD-10-CM | POA: Diagnosis not present

## 2022-08-16 DIAGNOSIS — K219 Gastro-esophageal reflux disease without esophagitis: Secondary | ICD-10-CM | POA: Diagnosis not present

## 2022-08-16 DIAGNOSIS — J309 Allergic rhinitis, unspecified: Secondary | ICD-10-CM | POA: Diagnosis not present

## 2022-08-16 DIAGNOSIS — J4520 Mild intermittent asthma, uncomplicated: Secondary | ICD-10-CM | POA: Diagnosis not present

## 2022-08-16 DIAGNOSIS — I10 Essential (primary) hypertension: Secondary | ICD-10-CM | POA: Diagnosis not present

## 2022-08-16 DIAGNOSIS — I2510 Atherosclerotic heart disease of native coronary artery without angina pectoris: Secondary | ICD-10-CM | POA: Diagnosis not present

## 2022-08-16 DIAGNOSIS — N401 Enlarged prostate with lower urinary tract symptoms: Secondary | ICD-10-CM

## 2022-08-16 DIAGNOSIS — Z23 Encounter for immunization: Secondary | ICD-10-CM | POA: Diagnosis not present

## 2022-08-16 DIAGNOSIS — I447 Left bundle-branch block, unspecified: Secondary | ICD-10-CM | POA: Diagnosis not present

## 2022-08-16 DIAGNOSIS — I700 Atherosclerosis of aorta: Secondary | ICD-10-CM | POA: Diagnosis not present

## 2022-08-16 DIAGNOSIS — Z0000 Encounter for general adult medical examination without abnormal findings: Secondary | ICD-10-CM | POA: Diagnosis not present

## 2022-08-16 MED ORDER — IOPAMIDOL (ISOVUE-370) INJECTION 76%
75.0000 mL | Freq: Once | INTRAVENOUS | Status: AC | PRN
  Administered 2022-08-16: 100 mL via INTRAVENOUS

## 2022-08-18 ENCOUNTER — Other Ambulatory Visit (HOSPITAL_COMMUNITY): Payer: Self-pay | Admitting: Interventional Radiology

## 2022-08-18 ENCOUNTER — Telehealth (HOSPITAL_COMMUNITY): Payer: Self-pay | Admitting: Radiology

## 2022-08-18 ENCOUNTER — Other Ambulatory Visit: Payer: Self-pay | Admitting: Physician Assistant

## 2022-08-18 DIAGNOSIS — N401 Enlarged prostate with lower urinary tract symptoms: Secondary | ICD-10-CM

## 2022-08-18 DIAGNOSIS — Z01818 Encounter for other preprocedural examination: Secondary | ICD-10-CM

## 2022-08-18 NOTE — Telephone Encounter (Signed)
Called pt to schedule PAE with Dr. Elby Showers. Left VM for him to call back. Also, left possible available dates for treatment of 6/24 or 6/28. JM

## 2022-08-29 ENCOUNTER — Telehealth: Payer: Self-pay | Admitting: Radiology

## 2022-08-29 ENCOUNTER — Other Ambulatory Visit: Payer: Self-pay

## 2022-08-29 ENCOUNTER — Other Ambulatory Visit (HOSPITAL_COMMUNITY): Payer: Self-pay | Admitting: Interventional Radiology

## 2022-08-29 ENCOUNTER — Other Ambulatory Visit: Payer: Self-pay | Admitting: Radiology

## 2022-08-29 ENCOUNTER — Encounter (HOSPITAL_COMMUNITY): Payer: Self-pay

## 2022-08-29 ENCOUNTER — Ambulatory Visit (HOSPITAL_COMMUNITY)
Admission: RE | Admit: 2022-08-29 | Discharge: 2022-08-29 | Disposition: A | Discharge status: Home / Self Care | Payer: Medicare Other | Source: Ambulatory Visit | Attending: Interventional Radiology | Admitting: Interventional Radiology

## 2022-08-29 DIAGNOSIS — R972 Elevated prostate specific antigen [PSA]: Secondary | ICD-10-CM | POA: Diagnosis not present

## 2022-08-29 DIAGNOSIS — N401 Enlarged prostate with lower urinary tract symptoms: Secondary | ICD-10-CM | POA: Insufficient documentation

## 2022-08-29 DIAGNOSIS — K589 Irritable bowel syndrome without diarrhea: Secondary | ICD-10-CM | POA: Insufficient documentation

## 2022-08-29 DIAGNOSIS — Z01818 Encounter for other preprocedural examination: Secondary | ICD-10-CM

## 2022-08-29 DIAGNOSIS — N529 Male erectile dysfunction, unspecified: Secondary | ICD-10-CM | POA: Insufficient documentation

## 2022-08-29 DIAGNOSIS — I10 Essential (primary) hypertension: Secondary | ICD-10-CM | POA: Insufficient documentation

## 2022-08-29 HISTORY — PX: IR US GUIDE VASC ACCESS LEFT: IMG2389

## 2022-08-29 HISTORY — PX: IR EMBO ARTERIAL NOT HEMORR HEMANG INC GUIDE ROADMAPPING: IMG5448

## 2022-08-29 HISTORY — PX: IR ANGIOGRAM PELVIS SELECTIVE OR SUPRASELECTIVE: IMG661

## 2022-08-29 LAB — CBC
HCT: 38.4 % — ABNORMAL LOW (ref 39.0–52.0)
Hemoglobin: 13.1 g/dL (ref 13.0–17.0)
MCH: 31.9 pg (ref 26.0–34.0)
MCHC: 34.1 g/dL (ref 30.0–36.0)
MCV: 93.4 fL (ref 80.0–100.0)
Platelets: 188 10*3/uL (ref 150–400)
RBC: 4.11 MIL/uL — ABNORMAL LOW (ref 4.22–5.81)
RDW: 12.5 % (ref 11.5–15.5)
WBC: 5.8 10*3/uL (ref 4.0–10.5)
nRBC: 0 % (ref 0.0–0.2)

## 2022-08-29 LAB — BASIC METABOLIC PANEL
Anion gap: 7 (ref 5–15)
BUN: 12 mg/dL (ref 8–23)
CO2: 26 mmol/L (ref 22–32)
Calcium: 8.8 mg/dL — ABNORMAL LOW (ref 8.9–10.3)
Chloride: 99 mmol/L (ref 98–111)
Creatinine, Ser: 0.74 mg/dL (ref 0.61–1.24)
GFR, Estimated: >60 mL/min (ref >60)
Glucose, Bld: 86 mg/dL (ref 70–99)
Potassium: 3.6 mmol/L (ref 3.5–5.1)
Sodium: 132 mmol/L — ABNORMAL LOW (ref 135–145)

## 2022-08-29 LAB — PROTIME-INR
INR: 1 (ref 0.8–1.2)
Prothrombin Time: 13.3 seconds (ref 11.4–15.2)

## 2022-08-29 MED ORDER — PHENAZOPYRIDINE HCL 100 MG PO TABS: 95.0000 mg | ORAL_TABLET | Freq: Three times a day (TID) | ORAL | 0 refills | Status: AC | PRN

## 2022-08-29 MED ORDER — FENTANYL CITRATE (PF) 100 MCG/2ML IJ SOLN
INTRAMUSCULAR | Status: AC
  Filled 2022-08-29: qty 2

## 2022-08-29 MED ORDER — PREDNISONE 20 MG PO TABS
20.0000 mg | ORAL_TABLET | Freq: Once | ORAL | Status: AC
  Administered 2022-08-29: 20 mg via ORAL
  Filled 2022-08-29: qty 1

## 2022-08-29 MED ORDER — IOHEXOL 300 MG/ML  SOLN
100.0000 mL | Freq: Once | INTRAMUSCULAR | Status: AC | PRN
  Administered 2022-08-29: 23 mL via INTRA_ARTERIAL

## 2022-08-29 MED ORDER — MIDAZOLAM HCL 2 MG/2ML IJ SOLN
INTRAMUSCULAR | Status: AC
  Filled 2022-08-29: qty 2

## 2022-08-29 MED ORDER — VERAPAMIL HCL 2.5 MG/ML IV SOLN
INTRA_ARTERIAL | Status: AC | PRN
  Administered 2022-08-29: 6 mL via INTRA_ARTERIAL

## 2022-08-29 MED ORDER — SOLIFENACIN SUCCINATE 5 MG PO TABS: 5.0000 mg | ORAL_TABLET | Freq: Every day | ORAL | 0 refills | Status: AC

## 2022-08-29 MED ORDER — HEPARIN SODIUM (PORCINE) 1000 UNIT/ML IJ SOLN
INTRAMUSCULAR | Status: AC
  Filled 2022-08-29: qty 10

## 2022-08-29 MED ORDER — MIDAZOLAM HCL 2 MG/2ML IJ SOLN
INTRAMUSCULAR | Status: AC | PRN
  Administered 2022-08-29 (×9): .5 mg via INTRAVENOUS
  Administered 2022-08-29 (×2): 1 mg via INTRAVENOUS
  Administered 2022-08-29 (×3): .5 mg via INTRAVENOUS

## 2022-08-29 MED ORDER — NITROGLYCERIN 1 MG/10 ML FOR IR/CATH LAB
INTRA_ARTERIAL | Status: AC
  Filled 2022-08-29: qty 10

## 2022-08-29 MED ORDER — SOLIFENACIN SUCCINATE 5 MG PO TABS: 5.0000 mg | ORAL_TABLET | Freq: Every day | ORAL | 0 refills | Status: DC

## 2022-08-29 MED ORDER — SODIUM CHLORIDE 0.9 % IV SOLN: INTRAVENOUS | Status: DC

## 2022-08-29 MED ORDER — NITROGLYCERIN 1 MG/10 ML FOR IR/CATH LAB
INTRA_ARTERIAL | Status: AC | PRN
  Administered 2022-08-29: 100 ug via INTRA_ARTERIAL

## 2022-08-29 MED ORDER — LIDOCAINE-EPINEPHRINE 1 %-1:100000 IJ SOLN
INTRAMUSCULAR | Status: AC
  Filled 2022-08-29: qty 1

## 2022-08-29 MED ORDER — LIDOCAINE-PRILOCAINE 2.5-2.5 % EX CREA
TOPICAL_CREAM | Freq: Once | CUTANEOUS | Status: AC
  Filled 2022-08-29: qty 5

## 2022-08-29 MED ORDER — CIPROFLOXACIN HCL 100 MG PO TABS: 100.0000 mg | ORAL_TABLET | Freq: Two times a day (BID) | ORAL | 0 refills | Status: DC

## 2022-08-29 MED ORDER — LIDOCAINE VISCOUS HCL 2 % MT SOLN
15.0000 mL | Freq: Once | OROMUCOSAL | Status: DC
  Filled 2022-08-29: qty 15

## 2022-08-29 MED ORDER — DIPHENHYDRAMINE HCL 50 MG/ML IJ SOLN
INTRAMUSCULAR | Status: AC | PRN
  Administered 2022-08-29: 50 mg via INTRAVENOUS

## 2022-08-29 MED ORDER — FENTANYL CITRATE (PF) 100 MCG/2ML IJ SOLN
INTRAMUSCULAR | Status: AC | PRN
  Administered 2022-08-29: 25 ug via INTRAVENOUS
  Administered 2022-08-29: 50 ug via INTRAVENOUS
  Administered 2022-08-29 (×3): 25 ug via INTRAVENOUS
  Administered 2022-08-29 (×4): 50 ug via INTRAVENOUS
  Administered 2022-08-29 (×2): 25 ug via INTRAVENOUS

## 2022-08-29 MED ORDER — HEPARIN SODIUM (PORCINE) 1000 UNIT/ML IJ SOLN
INTRAMUSCULAR | Status: AC | PRN
  Administered 2022-08-29: 4000 [IU] via INTRAVENOUS

## 2022-08-29 MED ORDER — IOHEXOL 300 MG/ML  SOLN
150.0000 mL | Freq: Once | INTRAMUSCULAR | Status: AC | PRN
  Administered 2022-08-29: 50 mL via INTRA_ARTERIAL

## 2022-08-29 MED ORDER — VERAPAMIL HCL 2.5 MG/ML IV SOLN
INTRAVENOUS | Status: AC
  Filled 2022-08-29: qty 2

## 2022-08-29 MED ORDER — PHENAZOPYRIDINE HCL 100 MG PO TABS: 95.0000 mg | ORAL_TABLET | Freq: Three times a day (TID) | ORAL | 0 refills | Status: DC | PRN

## 2022-08-29 MED ORDER — IOHEXOL 300 MG/ML  SOLN
150.0000 mL | Freq: Once | INTRAMUSCULAR | Status: AC | PRN
  Administered 2022-08-29: 15 mL via INTRA_ARTERIAL

## 2022-08-29 MED ORDER — DIPHENHYDRAMINE HCL 50 MG/ML IJ SOLN
INTRAMUSCULAR | Status: AC
  Filled 2022-08-29: qty 1

## 2022-08-29 MED ORDER — CIPROFLOXACIN IN D5W 400 MG/200ML IV SOLN
400.0000 mg | Freq: Once | INTRAVENOUS | Status: AC
  Administered 2022-08-29: 400 mg via INTRAVENOUS
  Filled 2022-08-29 (×2): qty 200

## 2022-08-29 MED ORDER — NITROGLYCERIN 2 % TD OINT
1.0000 [in_us] | TOPICAL_OINTMENT | Freq: Once | TRANSDERMAL | Status: AC
  Administered 2022-08-29: 1 [in_us] via TOPICAL
  Filled 2022-08-29: qty 30

## 2022-08-29 NOTE — H&P (Signed)
Chief Complaint: Patient was seen in consultation today for benign prostatic hyperplasia at the request of Dr Ranelle Oyster   Supervising Physician: Marliss Coots  Patient Status: [facility] - Out-pt  History of Present Illness: Ronnie Hardy is a 78 y.o. male    FULL Code status per pt  HTN; IBS; erectile dysfunction; elevated PSA Benign prostatic hyperplasia Lower urinary tract symptoms Was seen in consultation with Dr Elby Showers 08/02/22 for consideration of Prostate artery embolization  Dr Elby Showers note: He's met with his Urologist several times this year for treatment of his LUTS with complaints of urinary urgency, frequency and nocturia. He has had these symptoms for the last several years with some progression and has tried several medications over the years with variable success. He has had past issues with incomplete bladder emptying and a weak urinary stream. He is currently on tamsulosin and tadalafil daily with PRN use of sildenafil and his current IPSS score is 17. On recent TRUS at the Urology office, his prostate measured 104 ml. Dr. Annabell Howells discussed multiple treatment options including prostatectomy, HoLEP, Aquablation and PAE.  Benign prostatic hyperplasia with lower urinary tract symptoms (IPSS/QoL 12/4) who would be an excellent candidate for prostate artery embolization.  He wishes to proceed.   Scheduled today for IR procedure   Past Medical History:  Diagnosis Date   Arthritis    "all over"   Asthma    GERD (gastroesophageal reflux disease)    Hiatal hernia    Hypertension    IBS (irritable bowel syndrome)    PONV (postoperative nausea and vomiting)    Postoperative ileus (HCC) 05/22/2014   S/P back OR 05/19/2014    Past Surgical History:  Procedure Laterality Date   ACHILLES TENDON REPAIR Left 1990's   BACK SURGERY     ELBOW SURGERY Bilateral 2000's   "scoped them and took out bone chips"   EYE SURGERY Left 1980's   "removed splinters"   IR RADIOLOGIST  EVAL & MGMT  08/02/2022   LEFT HEART CATH AND CORONARY ANGIOGRAPHY N/A 06/05/2020   Procedure: LEFT HEART CATH AND CORONARY ANGIOGRAPHY;  Surgeon: Kathleene Hazel, MD;  [facility];  Service: Cardiovascular;  Laterality: N/A;   POSTERIOR FUSION LUMBAR SPINE, MINIMALLY INVASIVE  04/20/2014   ROTATOR CUFF REPAIR Left 2002   SHOULDER ARTHROSCOPY W/ ROTATOR CUFF REPAIR Right 2011   SHOULDER OPEN ROTATOR CUFF REPAIR Left 1990's; 2000's   TONSILLECTOMY  1954    Allergies: Meperidine and Anesthetics, amide  Medications: Prior to Admission medications   Medication Sig Start Date End Date Taking? Authorizing Provider  albuterol (PROVENTIL) (2.5 MG/3ML) 0.083% nebulizer solution Take 3 mLs (2.5 mg total) by nebulization every 6 (six) hours as needed for wheezing or shortness of breath. 09/20/17   Bevelyn Ngo, NP  B Complex Vitamins (VITAMIN-B COMPLEX) TABS Take by mouth as directed.     [provider]  budesonide-formoterol (SYMBICORT) 160-4.5 MCG/ACT inhaler Inhale 2 puffs into the lungs as needed.    [provider]  Eszopiclone 3 MG TABS Take 3 mg by mouth at bedtime. Take immediately before bedtime    [provider]  famotidine (PEPCID) 10 MG tablet Take 10 mg by mouth 2 (two) times daily. For heartburn    [provider]  MAGNESIUM PO Take 1 tablet by mouth daily.    [provider]  Multiple Vitamin (MULTI-VITAMINS) TABS Take 1 tablet by mouth daily.    [provider]  olmesartan (  BENICAR) 5 MG tablet Take 5 mg by mouth daily.    [provider]  Omega-3 1000 MG CAPS Take 1 capsule by mouth daily.     [provider]  rosuvastatin (CRESTOR) 5 MG tablet Take 1 tablet (5 mg total) by mouth 3 (three) times a week. 10/25/17   Nahser, Deloris Ping, MD  sildenafil (REVATIO) 20 MG tablet Take 1 tablet by mouth as needed.    [provider]  tamsulosin (FLOMAX) 0.4 MG CAPS capsule Take 1 capsule by  mouth as needed. 03/17/20   [provider]     Family History  Problem Relation Age of Onset   Heart disease Mother    Stroke Mother    Brain cancer Mother    Heart disease Father    Alzheimer's disease Brother    Asthma Brother    Asthma Daughter     Social History   Socioeconomic History   Marital status: Married    Spouse name: Not on file   Number of children: Not on file   Years of education: Not on file   Highest education level: Not on file  Occupational History   Not on file  Tobacco Use   Smoking status: Never    Passive exposure: Yes   Smokeless tobacco: Never   Tobacco comments:    Father & Mother when he was a child.   Vaping Use   Vaping Use: Never used  Substance and Sexual Activity   Alcohol use: Yes    Comment: 05/22/2014 "a drink q 2 wk or so"   Drug use: No   Sexual activity: Not Currently  Other Topics Concern   Not on file  Social History Narrative   [facility] (07/21/16):   Originally from [facility]. He has mostly lived in [place]. Has traveled to [place] to visit relatives. 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 No bird exposure. Previously had mold in a [place] that he [place] previously. Rarely ever uses his hot tub. 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 Also enjoys gardening. Does have probable asbestos exposure.    Social Determinants of Health   Financial Resource Strain: Not on file  Food Insecurity: Not on file  Transportation Needs: Not on file  Physical Activity: Not on file  Stress: Not on file  Social Connections: Not on file    Review of Systems: A 12 point ROS discussed and pertinent positives are indicated in the HPI above.  All other systems are negative.  Review of Systems  Constitutional:  Negative for activity change, appetite change, fatigue, fever and unexpected weight change.  Respiratory:  Negative for cough and shortness of breath.    Cardiovascular:  Negative for chest pain.  Gastrointestinal:  Negative for abdominal pain, nausea and vomiting.  Musculoskeletal:  Negative for back pain.  Neurological:  Negative for weakness.  Psychiatric/Behavioral:  Negative for behavioral problems and confusion.     Vital Signs: BP (!) 160/87   Pulse (!) 54   Temp 98.8 F (37.1 C) (Temporal)   Resp 16   Ht 5\' 10"  (1.778 m)   Wt 170 lb (77.1 kg)   SpO2 97%   BMI 24.39 kg/m   Advance Care Plan: The advanced care plan/surrogate decision maker was discussed at the time of visit and documented in the medical record.    Physical Exam Vitals reviewed.  HENT:     Mouth/Throat:  Mouth: Mucous membranes are moist.  Cardiovascular:     Rate and Rhythm: Normal rate and regular rhythm.     Heart sounds: Normal heart sounds.  Pulmonary:     Effort: Pulmonary effort is normal.     Breath sounds: Normal breath sounds. No wheezing.  Abdominal:     General: There is no distension.     Palpations: Abdomen is soft.     Tenderness: There is no abdominal tenderness.  Musculoskeletal:        General: Normal range of motion.     Right lower leg: No edema.     Left lower leg: No edema.  Skin:    General: Skin is warm.  Neurological:     Mental Status: He is alert and oriented to person, place, and time.  Psychiatric:        Behavior: Behavior normal.     Imaging: CT ANGIO PELVIS W OR WO CONTRAST  Result Date: 08/21/2022 CLINICAL DATA:  77 year old male with history of benign prostatic hyperplasia with lower urinary tract symptoms. EXAM: CT ANGIOGRAPHY OF PELVIS TECHNIQUE: Multidetector CT imaging of the abdomen and pelvis was performed using the standard protocol during bolus administration of intravenous contrast. Multiplanar reconstructed images and MIPs were obtained and reviewed to evaluate the vascular anatomy. RADIATION DOSE REDUCTION: This exam was performed according to the departmental dose-optimization program which  includes automated exposure control, adjustment of the mA and/or kV according to patient size and/or use of iterative reconstruction technique. CONTRAST:  ISOVUE-370 IOPAMIDOL (ISOVUE-370) INJECTION 76% COMPARISON:  None Available. FINDINGS: VASCULAR Distal aorta: Normal caliber and patent. Inflow: Tortuous bilateral common and external iliac arteries. Patent bilateral internal iliac arteries with Yamaki group A branching patterns. Proximal Outflow: Bilateral common femoral and visualized portions of the superficial and profunda femoral arteries are patent without evidence of aneurysm, dissection, vasculitis or significant stenosis. Prostatic arteries: The right prostatic artery arises from the proximal obturator artery, [place] type 2. The left prostatic artery arises from the proximal internal pudendal artery, [place] type 4. Veins: No obvious venous abnormality within the limitations of this arterial phase study. Review of the MIP images confirms the above findings. NON-VASCULAR Urinary Tract: The visualized lower poles of the kidneys are normal, without renal calculi, focal lesion, or hydronephrosis. Bladder is unremarkable. Bowel: No evidence of bowel wall thickening, distention, or inflammatory changes. Lymphatic: No pelvic lymphadenopathy. Reproductive: The prostate gland is enlarged measuring approximately 4.5 x 6.0 x 6.1 cm (AP by trans by cc), estimated volume of 82 g, with a few scattered internal dystrophic calcifications. There is median lobe bulge from the base the bladder. Other: Small fat and bowel containing right inguinal hernia. No pelvic fluid collections. Musculoskeletal: No acute osseous abnormality. Partially visualized multilevel lower lumbar posterior spinal fusion hardware without complicating features. Right sacroiliac joint fixation device in place. IMPRESSION: VASCULAR 1. Patent bilateral internal iliac arteries with Yamaki group A branching patterns. 2. The right prostatic  artery arises from the proximal obturator artery, [place] type 2. 3. The left prostatic artery arises from the proximal internal pudendal artery, [place] type 4. NON-VASCULAR 1. Prostatomegaly (82 g). 2. Small right inguinal hernia without complicating features. 3. Postsurgical changes after multilevel lower lumbar posterior fusion and right sacroiliac fixation. Marliss Coots, MD [facility] Radiology Electronically Signed   By: Marliss Coots M.D.   On: 08/21/2022 20:08   IR Radiologist Eval & Mgmt  Result Date: 08/02/2022 EXAM: NEW PATIENT OFFICE VISIT CHIEF COMPLAINT:  See Epic note. HISTORY OF PRESENT ILLNESS: See Epic note. REVIEW OF SYSTEMS: See Epic note. PHYSICAL EXAMINATION: See Epic note. ASSESSMENT AND PLAN: See Epic note. Marliss Coots, MD [facility] Radiology Electronically Signed   By: Marliss Coots M.D.   On: 08/02/2022 10:08    Labs:  CBC: No results for input(s): "WBC", "HGB", "HCT", "PLT" in the last 8760 hours.  COAGS: No results for input(s): "INR", "APTT" in the last 8760 hours.  BMP: No results for input(s): "NA", "K", "CL", "CO2", "GLUCOSE", "BUN", "CALCIUM", "CREATININE", "GFRNONAA", "GFRAA" in the last 8760 hours.  Invalid input(s): "CMP"  LIVER FUNCTION TESTS: No results for input(s): "BILITOT", "AST", "ALT", "ALKPHOS", "PROT", "ALBUMIN" in the last 8760 hours.  TUMOR MARKERS: No results for input(s): "AFPTM", "CEA", "CA199", "CHROMGRNA" in the last 8760 hours.  Assessment and Plan:  Scheduled today for Prostate artery embolization Risks and benefits of prostate artery embolization were discussed with the patient including, but not limited to bleeding, infection, vascular injury or contrast induced renal failure.  This interventional procedure involves the use of X-rays and because of the nature of the planned procedure, it is possible that we will have prolonged  use of X-ray fluoroscopy.  Potential radiation risks to you include (but are not limited to) the following: - A slightly elevated risk for cancer several years later in life. This risk is typically less than 0.5% percent. This risk is low in comparison to the normal incidence of human cancer, which is 33% for women and 50% for men according to the American Cancer Society. - Radiation induced injury can include skin redness, resembling a rash, tissue breakdown / ulcers and hair loss (which can be temporary or permanent).   The likelihood of either of these occurring depends on the difficulty of the procedure and whether you are sensitive to radiation due to previous procedures, disease, or genetic conditions.   IF your procedure requires a prolonged use of radiation, you will be notified and given written instructions for further action.  It is your responsibility to monitor the irradiated area for the 2 weeks following the procedure and to notify your physician if you are concerned that you have suffered a radiation induced injury.    All of the patient's questions were answered, patient is agreeable to proceed.  Consent signed and in chart.  Thank you for this interesting consult.  I greatly enjoyed meeting Ronnie Hardy and look forward to participating in their care.  A copy of this report was sent to the requesting provider on this date.  Electronically Signed: Robet Leu, PA-C 08/29/2022, 8:38 AM   I spent a total of    25 Minutes in face to face in clinical consultation, greater than 50% of which was counseling/coordinating care for prostate artery embolization

## 2022-08-29 NOTE — Progress Notes (Addendum)
   Pt underwent IR procedure today Prostate artery embolization  Post procedure meds per Dr Elby Showers: Solifenacin 5 mg  daily x 7 days Phenazopyridine 100 mg TID x 7 days Cipro 500 mg BID x 7 days  E Rx to [facility] [place], [place]

## 2022-08-29 NOTE — Procedures (Signed)
Interventional Radiology Procedure Note  Procedure: Prostate artery embolization  Findings: Please refer to procedural dictation for full description. Very tortuous pelvic arterial anatomy.  Unsuccessful catheterization of poorly visualized right prostatic artery arising from proximal right obturator artery.  Successful catheterization and embolization of left prostatic artery.  Left radial artery access, TR band applied.  Complications: None immediate  Estimated Blood Loss: < 5 mL  Recommendations: TR band release protocol - 15 mL at 13:45. Remove Foley. Please discharge 2 hours after procedure, if stable.  -solifenacin 5 mg QD x 7 days  -phenazopyridine 100 mg TID x 7 days  -ciprofloxacin 500 mg BID x 7 days    Marliss Coots, MD

## 2022-08-29 NOTE — Progress Notes (Signed)
Dr Carlis Stable returned page, foley was to be dc'd in IR but gave verbal to DC now.

## 2022-08-30 ENCOUNTER — Telehealth (HOSPITAL_COMMUNITY): Payer: Self-pay | Admitting: Student

## 2022-08-30 MED ORDER — METHYLPREDNISOLONE 4 MG PO TBPK: ORAL_TABLET | ORAL | 0 refills | Status: DC

## 2022-08-30 NOTE — Telephone Encounter (Signed)
Patient with urinary retention post- PAE. Medrol dose pack [facility] [place].   Alwyn Ren, [place] [phone] 08/30/2022, 7:44 AM

## 2022-08-30 NOTE — Telephone Encounter (Signed)
done

## 2022-08-31 ENCOUNTER — Telehealth: Payer: Self-pay | Admitting: Radiology

## 2022-08-31 MED ORDER — CIPROFLOXACIN HCL 500 MG PO TABS: 500.0000 mg | ORAL_TABLET | Freq: Two times a day (BID) | ORAL | 0 refills | Status: AC

## 2022-08-31 NOTE — Telephone Encounter (Signed)
   Rx for Cipro 500 mg BID #14 ERx to [facility] [place], [place]

## 2022-09-01 NOTE — Telephone Encounter (Signed)
done

## 2022-09-06 DIAGNOSIS — M461 Sacroiliitis, not elsewhere classified: Secondary | ICD-10-CM | POA: Diagnosis not present

## 2022-09-06 DIAGNOSIS — M533 Sacrococcygeal disorders, not elsewhere classified: Secondary | ICD-10-CM | POA: Diagnosis not present

## 2022-09-20 ENCOUNTER — Other Ambulatory Visit: Payer: Self-pay | Admitting: Interventional Radiology

## 2022-09-20 DIAGNOSIS — N138 Other obstructive and reflux uropathy: Secondary | ICD-10-CM

## 2022-09-27 ENCOUNTER — Ambulatory Visit (HOSPITAL_COMMUNITY): Payer: Medicare Other | Attending: Cardiology

## 2022-09-27 DIAGNOSIS — I447 Left bundle-branch block, unspecified: Secondary | ICD-10-CM | POA: Diagnosis not present

## 2022-09-27 LAB — ECHOCARDIOGRAM COMPLETE
Area-P 1/2: 2.44 cm2
Est EF: 55
S' Lateral: 3.1 cm

## 2022-09-30 ENCOUNTER — Other Ambulatory Visit (HOSPITAL_COMMUNITY): Payer: Medicare Other

## 2022-10-01 NOTE — Progress Notes (Signed)
Referring Physician(s): Dr. Bjorn Pippin  Chief Complaint: The patient is seen in virtual follow up today s/p prostate artery embolization  History of present illness: Ronnie Hardy is a 77 y.o. male with a medical history significant for HTN, IBS, back pain, erectile dysfunction, elevated PSA and benign prostatic hyperplasia with lower urinary tract symptoms. He's met with his Urologist several times this year for treatment of his LUTS with complaints of urinary urgency, frequency and nocturia. He has had these symptoms for the last several years with some progression and has tried several medications over the years with variable success.  He has had past issues with incomplete bladder emptying and a weak urinary stream. He is currently on tamsulosin and tadalafil daily with PRN use of sildenafil and his current IPSS score is 17. A TRUS obtained at the Urology office showed his prostate measuring 104 ml.  Dr. Annabell Howells discussed multiple treatment options including prostatectomy, HoLEP, Aquablation and PAE.  The patient was interested in pursuing minimally invasive options as opposed to surgery. He was referred to Interventional Radiology to discuss prostate artery embolization and I met with him 08/02/22. He was found to be an excellent candidate for this procedure and this was performed 08/29/22. He tolerated the procedure well and he was discharged home the same day. He experienced some post-procedure urinary retention and was prescribed a Medrol Dose Pack.   Ronnie Hardy presents today via virtual telephone visit for follow up. He states that he is slightly better at night with nocturia, still experiencing 2-3x.  Urgency has improved.      10/03/22   Past Medical History:  Diagnosis Date   Arthritis    "all over"   Asthma    GERD (gastroesophageal reflux disease)    Hiatal hernia    Hypertension    IBS (irritable bowel syndrome)    PONV (postoperative nausea and vomiting)     Postoperative ileus (HCC) 05/22/2014   S/P back OR 05/19/2014    Past Surgical History:  Procedure Laterality Date   ACHILLES TENDON REPAIR Left 1990's   BACK SURGERY     ELBOW SURGERY Bilateral 2000's   "scoped them and took out bone chips"   EYE SURGERY Left 1980's   "removed splinters"   IR ANGIOGRAM PELVIS SELECTIVE OR SUPRASELECTIVE  08/29/2022   IR EMBO ARTERIAL NOT HEMORR HEMANG INC GUIDE ROADMAPPING  08/29/2022   IR RADIOLOGIST EVAL & MGMT  08/02/2022   IR US GUIDE VASC ACCESS LEFT  08/29/2022   IR US GUIDE VASC ACCESS LEFT  08/29/2022   LEFT HEART CATH AND CORONARY ANGIOGRAPHY N/A 06/05/2020   Procedure: LEFT HEART CATH AND CORONARY ANGIOGRAPHY;  Surgeon: Kathleene Hazel, MD;  Location: [facility] INV[facility];  Service: Cardiovascular;  Laterality: N/A;   POSTERIOR FUSION LUMBAR SPINE, MINIMALLY INVASIVE  04/20/2014   ROTATOR CUFF REPAIR Left 2002   SHOULDER ARTHROSCOPY W/ ROTATOR CUFF REPAIR Right 2011   SHOULDER OPEN ROTATOR CUFF REPAIR Left 1990's; 2000's   TONSILLECTOMY  1954    Allergies: Meperidine and Anesthetics, amide  Medications: Prior to Admission medications   Medication Sig Start Date End Date Taking? Authorizing Provider  albuterol (PROVENTIL) (2.5 MG/3ML) 0.083% nebulizer solution Take 3 mLs (2.5 mg total) by nebulization every 6 (six) hours as needed for wheezing or shortness of breath. 09/20/17   Bevelyn Ngo, NP  B Complex Vitamins (VITAMIN-B COMPLEX) TABS Take by mouth as directed.     [provider]  budesonide-formoterol [facility])  160-4.5 MCG/ACT inhaler Inhale 2 puffs into the lungs as needed.    [provider]  dutasteride (AVODART) 0.5 MG capsule Take 0.5 mg by mouth daily.    [provider]  Eszopiclone 3 MG TABS Take 3 mg by mouth at bedtime. Take immediately before bedtime    [provider]  famotidine (PEPCID) 10 MG tablet Take 10 mg by mouth 2 (two) times daily. For heartburn    [provider]   MAGNESIUM PO Take 1 tablet by mouth daily.    [provider]  methylPREDNISolone (MEDROL DOSEPAK) 4 MG TBPK tablet Please dispense Medrol tapered dose x 6 days 08/30/22   Mickie Kay, NP  Multiple Vitamin (MULTI-VITAMINS) TABS Take 1 tablet by mouth daily.    [provider]  olmesartan (BENICAR) 5 MG tablet Take 5 mg by mouth daily.    [provider]  Omega-3 1000 MG CAPS Take 1 capsule by mouth daily.     [provider]  rosuvastatin (CRESTOR) 5 MG tablet Take 1 tablet (5 mg total) by mouth 3 (three) times a week. 10/25/17   Nahser, Deloris Ping, MD  sildenafil (REVATIO) 20 MG tablet Take 1 tablet by mouth as needed.    [provider]  tamsulosin (FLOMAX) 0.4 MG CAPS capsule Take 1 capsule by mouth as needed. 03/17/20   [provider]     Family History  Problem Relation Age of Onset   Heart disease Mother    Stroke Mother    Brain cancer Mother    Heart disease Father    Alzheimer's disease Brother    Asthma Brother    Asthma Daughter     Social History   Socioeconomic History   Marital status: Married    Spouse name: Not on file   Number of children: Not on file   Years of education: Not on file   Highest education level: Not on file  Occupational History   Not on file  Tobacco Use   Smoking status: Never    Passive exposure: Yes   Smokeless tobacco: Never   Tobacco comments:    Father & Mother when he was a child.   Vaping Use   Vaping status: Never Used  Substance and Sexual Activity   Alcohol use: Yes    Comment: 05/22/2014 "a drink q 2 wk or so"   Drug use: No   Sexual activity: Not Currently  Other Topics Concern   Not on file  Social History Narrative   [facility] (07/21/16):   Originally from [facility]. He has mostly lived in [place]. Has traveled to [place] to visit relatives. 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 No bird exposure. Previously had mold in  a lake [place] that he had previously. Rarely ever uses his hot tub. 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 Also enjoys gardening. Does have probable asbestos exposure.    Social Determinants of Health   Financial Resource Strain: Not on file  Food Insecurity: Not on file  Transportation Needs: Not on file  Physical Activity: Not on file  Stress: Not on file  Social Connections: Not on file     Vital Signs: There were no vitals taken for this visit.  No physical exam was performed in lieu of virtual telephone visit.   Imaging: CTA 08/16/22  R PA origin.    PAE 08/29/22  Right PA ostium arising  from proximal obturator.   Left PA - pre embolization   Left PA - post embolization   Labs:  CBC: Recent Labs    08/29/22 0856  WBC 5.8  HGB 13.1  HCT 38.4*  PLT 188    COAGS: Recent Labs    08/29/22 0856  INR 1.0    BMP: Recent Labs    08/29/22 0856  NA 132*  K 3.6  CL 99  CO2 26  GLUCOSE 86  BUN 12  CALCIUM 8.8*  CREATININE 0.74  GFRNONAA >60    LIVER FUNCTION TESTS: No results for input(s): "BILITOT", "AST", "ALT", "ALKPHOS", "PROT", "ALBUMIN" in the last 8760 hours.  Assessment and Plan: Fonzo Combest is a 77 y.o. male with a medical history significant for benign prostatic hyperplasia with lower urinary tract symptoms (IPSS/QoL 12/4 --> 13/3) status post left prostate artery embolization on 08/29/22.  His right prostatic artery was unable to be cannulated from radial access due to supreme tortuosity of his aorta and pelvic vessels, however was visualized on angiogram arising from the proximal right obturator artery.  He states some subjective improvement in symptoms after left PAE, notably decreased urgency and nocturia.  I have offered treating the right side from an ipsilateral common femoral artery approach and he is amenable and wishes to proceed.   Plan for pelvic angiogram and right prostate artery embolization at [facility] with moderate sedation. No Foley catheter per strong patient preference.   Marliss Coots, MD Pager: [phone]    I spent a total of 40 Minutes in virtual clinical consultation, greater than 50% of which was counseling/coordinating care for benign prostatic hyperplasia.

## 2022-10-03 ENCOUNTER — Ambulatory Visit: Admission: RE | Admit: 2022-10-03 | Payer: Medicare Other | Source: Ambulatory Visit

## 2022-10-03 ENCOUNTER — Encounter: Payer: Self-pay | Admitting: Cardiovascular Disease

## 2022-10-03 DIAGNOSIS — N401 Enlarged prostate with lower urinary tract symptoms: Secondary | ICD-10-CM

## 2022-10-03 HISTORY — PX: IR RADIOLOGIST EVAL & MGMT: IMG5224

## 2022-10-03 NOTE — Progress Notes (Unsigned)
Ronnie Hardy Date of Birth  1945/05/27       [facility] Office [address], [address]  [address] [place], [place]  [address]   [place], [place]  [address] [phone]     [phone]   Fax  [phone]    Fax [phone]  Problem List: 1. Hypertension 2. Asthma 3. Coronary artery calcifications   01/07/2013 Ronnie Hardy is a 77 yo with hx of HTN.  Avoids salty or fatty foods.  Works out daily .    He has had HTN for the past 5 years or so.   He has a family hx of heart disease ( mother died of stroke, father died of MI - smoker)   He has throbbing in his throat and then knows that his BP is elevated.   He has not been diagnosed with sleep apnea but admits that he doesn't sleep well.    Feb. 4, 2014: BP has been 130s / 80s.  He was started on Horizant but it seems to be causing some peripheral edema.   Ronnie Hardy is seen back for evaluation of coronary artery calcifications . In March he developed a bid chest cold.   Was still short of breath.   CXR showed some calcifications on the aorta ( and ? Coronary )    A coronary calcium score was 390 which is in the 68% of matched subjects   Was started on a statin and ASA by Dr. Renne Crigler.   Works out 5 times a week. No angina with work outs .   Has some  He goes mountain biking on occasion. Has some dyspnea recently which seems to resolve as he continues to cycle.  The tightness seems to be worse in the afternoon and evening.    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  Had a heart cath by Dr. Lorenda Hatchet 2003.  Cath was normal.     August 24, 2016   Ronnie Hardy is seen today  Had a myoview on Jul 14, 2016 that showed no ischemia and normal LV function Cornaary calcium score was 390 .  No recent chest pain .  No dyspnea   Aug. 20, 2019:  Ronnie Hardy is seen today for eval of his contiued shortness of breath .  Has a hx of asthma. Saw pulmonary last week  Spirometry was normal in the  office Denies any chest pain  Has had dyspnea for 7-8 weeks,   Had a chest cold several weeks ago and this shortness of breath has lingered.  Tightness with inspiration .   Feels like his asthma  Has had 2 steroid dose packs which helped a little  Still able to go to the gym without any chest pain or dyspnea     Oct. 12, 2020   Ronnie Hardy is seen today for follow up visit for his coronary artery calcifications and hyperlipidemia He has had a negative myoview in 2018.  He is back at work now. Has developed a LBBB since I I last saw him   Is working out. Feels well, no CP or dyspnea.    February 22, 2019:  Ronnie Hardy is seen today for follow-up visit.  He has known coronary artery calcifications.  We recently did coronary CT angiogram.    Coarsened coronary calcium score is 507 which is 68th percentile for age/sex matched controls.    The CT angiogram reveals mild plaque in the  left anterior descending artery with moderate plaque in the mid LAD.  The left circumflex artery is small and nondominant and   is free of disease.  The right coronary artery is large and dominant and has mild plaque in the distal RCA associated with a 25 to 49% stenosis.  Takes crestor 5 mg 2-3 times a week.  Getting labs today .  Very active    June 02, 2020: Ronnie Hardy is seen today for follow up of his CAD Cor CTA shows : coronary calcium score is 507 which is 68th percentile for age/sex matched controls.    The CT angiogram reveals mild plaque in the left anterior descending artery with moderate plaque in the mid LAD.  The left circumflex artery is small and nondominant and   is free of disease.  The right coronary artery is large and dominant and has mild plaque in the distal RCA associated with a 25 to 49% stenosis We have taken an aggressive approach to his lipids   Has had more shortness of breath recently + chest tightness - especially at night ,  The tightness was new and different which prompted him to call for an  appt.  he has a long hx of asthma so he was unsure if this was an actual cardiac issue vs. Asthma.   covid last year ( likely delta variant)  He had a heart cath years ago ( while training for [employer] )   Jul 10, 2020: Ronnie Hardy was seen for symptoms c/w UAP during his last visit Cath revealed mild - moderate CAD  EF 50-55% No further angina  Is having some lower back , right leg / buttocks pain  Has seen PT  Still working out   LDL has been 44   October 04, 2021 Ronnie Hardy is seen for follow up of his Coronary artery calcifications Has had symptoms of UAP  Cath revealed mild - mod CAE  Still working out regularly  No CP  Cycling some , wants to ride more    October 04, 2022  Ronnie Hardy is seen for follow up of his LBBB, coronary calcifications Echo from September 27, 2022: Normal LV systolic function with EF 55%. There is some dyssynchrony associated with his LBBB Grade II ( moderate) DD Mild-moderate MR  No significant changes compared to previous echo     Current Outpatient Medications on File Prior to Visit  Medication Sig Dispense Refill   albuterol (PROVENTIL) (2.5 MG/3ML) 0.083% nebulizer solution Take 3 mLs (2.5 mg total) by nebulization every 6 (six) hours as needed for wheezing or shortness of breath. 75 mL 2   B Complex Vitamins (VITAMIN-B COMPLEX) TABS Take by mouth as directed.      budesonide-formoterol (SYMBICORT) 160-4.5 MCG/ACT inhaler Inhale 2 puffs into the lungs as needed.     dutasteride (AVODART) 0.5 MG capsule Take 0.5 mg by mouth daily.     Eszopiclone 3 MG TABS Take 3 mg by mouth at bedtime. Take immediately before bedtime     famotidine (PEPCID) 10 MG tablet Take 10 mg by mouth 2 (two) times daily. For heartburn     MAGNESIUM PO Take 1 tablet by mouth daily.     methylPREDNISolone (MEDROL DOSEPAK) 4 MG TBPK tablet Please dispense Medrol tapered dose x 6 days 21 tablet 0   Multiple Vitamin (MULTI-VITAMINS) TABS Take 1 tablet by mouth daily.     olmesartan  (BENICAR) 5 MG tablet Take 5 mg by mouth daily.  Omega-3 1000 MG CAPS Take 1 capsule by mouth daily.      rosuvastatin (CRESTOR) 5 MG tablet Take 1 tablet (5 mg total) by mouth 3 (three) times a week. 45 tablet 3   sildenafil (REVATIO) 20 MG tablet Take 1 tablet by mouth as needed.     tamsulosin (FLOMAX) 0.4 MG CAPS capsule Take 1 capsule by mouth as needed.     No current facility-administered medications on file prior to visit.    Allergies  Allergen Reactions   Meperidine Nausea And Vomiting   Anesthetics, Amide Nausea Only    Surgical anesthetic    Past Medical History:  Diagnosis Date   Arthritis    "all over"   Asthma    GERD (gastroesophageal reflux disease)    Hiatal hernia    Hypertension    IBS (irritable bowel syndrome)    PONV (postoperative nausea and vomiting)    Postoperative ileus (HCC) 05/22/2014   S/P back OR 05/19/2014    Past Surgical History:  Procedure Laterality Date   ACHILLES TENDON REPAIR Left 1990's   BACK SURGERY     ELBOW SURGERY Bilateral 2000's   "scoped them and took out bone chips"   EYE SURGERY Left 1980's   "removed splinters"   IR ANGIOGRAM PELVIS SELECTIVE OR SUPRASELECTIVE  08/29/2022   IR EMBO ARTERIAL NOT HEMORR HEMANG INC GUIDE ROADMAPPING  08/29/2022   IR RADIOLOGIST EVAL & MGMT  08/02/2022   IR US GUIDE VASC ACCESS LEFT  08/29/2022   IR US GUIDE VASC ACCESS LEFT  08/29/2022   LEFT HEART CATH AND CORONARY ANGIOGRAPHY N/A 06/05/2020   Procedure: LEFT HEART CATH AND CORONARY ANGIOGRAPHY;  Surgeon: Kathleene Hazel, MD;  Location: [facility];  Service: Cardiovascular;  Laterality: N/A;   POSTERIOR FUSION LUMBAR SPINE, MINIMALLY INVASIVE  04/20/2014   ROTATOR CUFF REPAIR Left 2002   SHOULDER ARTHROSCOPY W/ ROTATOR CUFF REPAIR Right 2011   SHOULDER OPEN ROTATOR CUFF REPAIR Left 1990's; 2000's   TONSILLECTOMY  1954    Social History   Tobacco Use  Smoking Status Never   Passive exposure: Yes  Smokeless Tobacco Never   Tobacco Comments   Father & Mother when he was a child.     Social History   Substance and Sexual Activity  Alcohol Use Yes   Comment: 05/22/2014 "a drink q 2 wk or so"    Family History  Problem Relation Age of Onset   Heart disease Mother    Stroke Mother    Brain cancer Mother    Heart disease Father    Alzheimer's disease Brother    Asthma Brother    Asthma Daughter     Reviw of Systems:  Reviewed in the HPI.  All other systems are negative.  Physical Exam: There were no vitals taken for this visit.  No BP recorded.  {Refresh Note OR Click here to enter BP  :1}***    GEN:  Well nourished, well developed in no acute distress HEENT: Normal NECK: No JVD; No carotid bruits LYMPHATICS: No lymphadenopathy CARDIAC: RRR ***, no murmurs, rubs, gallops RESPIRATORY:  Clear to auscultation without rales, wheezing or rhonchi  ABDOMEN: Soft, non-tender, non-distended MUSCULOSKELETAL:  No edema; No deformity  SKIN: Warm and dry NEUROLOGIC:  Alert and oriented x 3     ECG:     .    Assessment / Plan:   1.  Unstable angina:        2.  LBBB :   -  2. Hypertension:        Kristeen Miss, MD  10/03/2022 6:03 AM    [facility] [address] [place],  [address] [place], [place]  [address] Pager [phone] Phone: [phone]; Fax: [phone]

## 2022-10-04 ENCOUNTER — Ambulatory Visit: Payer: Medicare Other | Attending: Cardiovascular Disease | Admitting: Cardiovascular Disease

## 2022-10-04 ENCOUNTER — Encounter: Payer: Self-pay | Admitting: Cardiovascular Disease

## 2022-10-04 VITALS — BP 132/74 | HR 58 | Ht 70.0 in | Wt 170.6 lb

## 2022-10-04 DIAGNOSIS — I2510 Atherosclerotic heart disease of native coronary artery without angina pectoris: Secondary | ICD-10-CM

## 2022-10-04 DIAGNOSIS — I447 Left bundle-branch block, unspecified: Secondary | ICD-10-CM

## 2022-10-04 NOTE — Patient Instructions (Signed)

## 2022-10-05 ENCOUNTER — Other Ambulatory Visit (HOSPITAL_COMMUNITY): Payer: Self-pay | Admitting: Interventional Radiology

## 2022-10-05 DIAGNOSIS — N138 Other obstructive and reflux uropathy: Secondary | ICD-10-CM

## 2022-10-12 ENCOUNTER — Telehealth (HOSPITAL_COMMUNITY): Payer: Self-pay | Admitting: Radiology

## 2022-10-12 NOTE — Telephone Encounter (Signed)
Called pt, left VM for him to call and let me know if he can do his PAE with Dr. Elby Showers on 8/27 at 8 am at [facility]

## 2022-10-19 ENCOUNTER — Other Ambulatory Visit: Payer: Self-pay | Admitting: Physician Assistant

## 2022-10-19 DIAGNOSIS — Z961 Presence of intraocular lens: Secondary | ICD-10-CM | POA: Diagnosis not present

## 2022-10-19 DIAGNOSIS — Z01818 Encounter for other preprocedural examination: Secondary | ICD-10-CM

## 2022-10-21 ENCOUNTER — Encounter (HOSPITAL_COMMUNITY): Payer: Self-pay

## 2022-10-21 ENCOUNTER — Telehealth (HOSPITAL_COMMUNITY): Payer: Self-pay | Admitting: Radiology

## 2022-10-21 DIAGNOSIS — M533 Sacrococcygeal disorders, not elsewhere classified: Secondary | ICD-10-CM | POA: Diagnosis not present

## 2022-10-21 NOTE — Telephone Encounter (Signed)
Called pt, left VM telling him per Dr. Elby Showers he is to hold Aspirin 81mg  x5 days prior to his PAE. Last dose of Aspirin will be on 8/21. Sent patient a message on MyChart also with this information. JM

## 2022-10-28 ENCOUNTER — Telehealth (HOSPITAL_COMMUNITY): Payer: Self-pay | Admitting: Radiology

## 2022-10-28 NOTE — Telephone Encounter (Signed)
Tried to call pt on both his work and cell phone. Unable to reach pt. Wanted to make sure he understood and received my message on holding his Aspirin x5 days prior to his procedure with Dr. Elby Showers scheduled for 8/27. JM

## 2022-10-31 ENCOUNTER — Other Ambulatory Visit: Payer: Self-pay | Admitting: Radiology

## 2022-10-31 DIAGNOSIS — S39012D Strain of muscle, fascia and tendon of lower back, subsequent encounter: Secondary | ICD-10-CM | POA: Diagnosis not present

## 2022-11-01 ENCOUNTER — Other Ambulatory Visit (HOSPITAL_COMMUNITY): Payer: Self-pay | Admitting: Interventional Radiology

## 2022-11-01 ENCOUNTER — Other Ambulatory Visit: Payer: Self-pay

## 2022-11-01 ENCOUNTER — Ambulatory Visit (HOSPITAL_COMMUNITY)
Admission: RE | Admit: 2022-11-01 | Discharge: 2022-11-01 | Disposition: A | Discharge status: Home / Self Care | Payer: Medicare Other | Source: Ambulatory Visit | Attending: Interventional Radiology | Admitting: Interventional Radiology

## 2022-11-01 ENCOUNTER — Encounter (HOSPITAL_COMMUNITY): Payer: Self-pay

## 2022-11-01 DIAGNOSIS — N138 Other obstructive and reflux uropathy: Secondary | ICD-10-CM | POA: Diagnosis not present

## 2022-11-01 DIAGNOSIS — Z7722 Contact with and (suspected) exposure to environmental tobacco smoke (acute) (chronic): Secondary | ICD-10-CM | POA: Diagnosis not present

## 2022-11-01 DIAGNOSIS — N401 Enlarged prostate with lower urinary tract symptoms: Secondary | ICD-10-CM | POA: Insufficient documentation

## 2022-11-01 DIAGNOSIS — E041 Nontoxic single thyroid nodule: Secondary | ICD-10-CM | POA: Diagnosis not present

## 2022-11-01 DIAGNOSIS — Z01818 Encounter for other preprocedural examination: Secondary | ICD-10-CM

## 2022-11-01 DIAGNOSIS — N390 Urinary tract infection, site not specified: Secondary | ICD-10-CM | POA: Diagnosis not present

## 2022-11-01 HISTORY — PX: IR ANGIOGRAM SELECTIVE EACH ADDITIONAL VESSEL: IMG667

## 2022-11-01 HISTORY — PX: IR EMBO ARTERIAL NOT HEMORR HEMANG INC GUIDE ROADMAPPING: IMG5448

## 2022-11-01 HISTORY — PX: IR US GUIDE VASC ACCESS LEFT: IMG2389

## 2022-11-01 HISTORY — PX: IR ANGIOGRAM PELVIS SELECTIVE OR SUPRASELECTIVE: IMG661

## 2022-11-01 HISTORY — PX: IR US GUIDE VASC ACCESS RIGHT: IMG2390

## 2022-11-01 LAB — BASIC METABOLIC PANEL
Anion gap: 11 (ref 5–15)
BUN: 14 mg/dL (ref 8–23)
CO2: 21 mmol/L — ABNORMAL LOW (ref 22–32)
Calcium: 9 mg/dL (ref 8.9–10.3)
Chloride: 99 mmol/L (ref 98–111)
Creatinine, Ser: 0.74 mg/dL (ref 0.61–1.24)
GFR, Estimated: >60 mL/min (ref >60)
Glucose, Bld: 94 mg/dL (ref 70–99)
Potassium: 4.1 mmol/L (ref 3.5–5.1)
Sodium: 131 mmol/L — ABNORMAL LOW (ref 135–145)

## 2022-11-01 LAB — CBC
HCT: 41.6 % (ref 39.0–52.0)
Hemoglobin: 14.1 g/dL (ref 13.0–17.0)
MCH: 30.5 pg (ref 26.0–34.0)
MCHC: 33.9 g/dL (ref 30.0–36.0)
MCV: 90 fL (ref 80.0–100.0)
Platelets: 175 10*3/uL (ref 150–400)
RBC: 4.62 MIL/uL (ref 4.22–5.81)
RDW: 12.4 % (ref 11.5–15.5)
WBC: 4.3 10*3/uL (ref 4.0–10.5)
nRBC: 0 % (ref 0.0–0.2)

## 2022-11-01 LAB — PROTIME-INR
INR: 1 (ref 0.8–1.2)
Prothrombin Time: 13.2 seconds (ref 11.4–15.2)

## 2022-11-01 MED ORDER — CIPROFLOXACIN HCL 500 MG PO TABS: 500.0000 mg | ORAL_TABLET | Freq: Two times a day (BID) | ORAL | 0 refills | Status: AC

## 2022-11-01 MED ORDER — IODIXANOL 320 MG/ML IV SOLN
100.0000 mL | Freq: Once | INTRAVENOUS | Status: AC | PRN
  Administered 2022-11-01: 100 mL via INTRA_ARTERIAL

## 2022-11-01 MED ORDER — MIDAZOLAM HCL 2 MG/2ML IJ SOLN
INTRAMUSCULAR | Status: AC
  Filled 2022-11-01: qty 2

## 2022-11-01 MED ORDER — FENTANYL CITRATE (PF) 100 MCG/2ML IJ SOLN
INTRAMUSCULAR | Status: AC
  Filled 2022-11-01: qty 4

## 2022-11-01 MED ORDER — NITROGLYCERIN 1 MG/10 ML FOR IR/CATH LAB
INTRA_ARTERIAL | Status: AC
  Filled 2022-11-01: qty 10

## 2022-11-01 MED ORDER — MIDAZOLAM HCL 2 MG/2ML IJ SOLN
INTRAMUSCULAR | Status: AC
  Filled 2022-11-01: qty 4

## 2022-11-01 MED ORDER — CIPROFLOXACIN IN D5W 400 MG/200ML IV SOLN
400.0000 mg | Freq: Once | INTRAVENOUS | Status: AC
  Administered 2022-11-01: 400 mg via INTRAVENOUS
  Filled 2022-11-01: qty 200

## 2022-11-01 MED ORDER — SOLIFENACIN SUCCINATE 5 MG PO TABS: 5.0000 mg | ORAL_TABLET | Freq: Every day | ORAL | 0 refills | Status: AC

## 2022-11-01 MED ORDER — PHENAZOPYRIDINE HCL 100 MG PO TABS: 100.0000 mg | ORAL_TABLET | Freq: Three times a day (TID) | ORAL | 0 refills | Status: AC

## 2022-11-01 MED ORDER — METHYLPREDNISOLONE 4 MG PO TABS: 4.0000 mg | ORAL_TABLET | Freq: Every day | ORAL | 0 refills | Status: AC

## 2022-11-01 MED ORDER — LIDOCAINE-EPINEPHRINE 1 %-1:100000 IJ SOLN
INTRAMUSCULAR | Status: AC
  Filled 2022-11-01: qty 1

## 2022-11-01 MED ORDER — FENTANYL CITRATE (PF) 100 MCG/2ML IJ SOLN
INTRAMUSCULAR | Status: AC
  Filled 2022-11-01: qty 2

## 2022-11-01 MED ORDER — PREDNISONE 20 MG PO TABS
20.0000 mg | ORAL_TABLET | Freq: Once | ORAL | Status: AC
  Administered 2022-11-01: 20 mg via ORAL
  Filled 2022-11-01: qty 1

## 2022-11-01 MED ORDER — FENTANYL CITRATE (PF) 100 MCG/2ML IJ SOLN
INTRAMUSCULAR | Status: AC | PRN
  Administered 2022-11-01 (×3): 50 ug via INTRAVENOUS
  Administered 2022-11-01 (×2): 25 ug via INTRAVENOUS

## 2022-11-01 MED ORDER — SODIUM CHLORIDE 0.9 % IV SOLN
INTRAVENOUS | Status: AC | PRN
  Administered 2022-11-01: 10 mL/h via INTRAVENOUS

## 2022-11-01 MED ORDER — NITROGLYCERIN 1 MG/10 ML FOR IR/CATH LAB
INTRA_ARTERIAL | Status: AC | PRN
  Administered 2022-11-01: 200 ug via INTRA_ARTERIAL

## 2022-11-01 MED ORDER — MIDAZOLAM HCL 2 MG/2ML IJ SOLN
INTRAMUSCULAR | Status: AC | PRN
  Administered 2022-11-01 (×6): 1 mg via INTRAVENOUS

## 2022-11-01 NOTE — Addendum Note (Signed)
Encounter addended by: Kirk Ruths, RT on: 11/01/2022 7:39 AM  Actions taken: Imaging Exam ended

## 2022-11-01 NOTE — Progress Notes (Signed)
Interventional Radiology Brief Note:  The following medications were called into patient's preferred pharmacy, [facility] on [address] in [place]:  4 mg medrol dosepak x 5 days  Solifenacin 5 mg QD x 7 days  Phenazopyridine 100 mg TID x 7 days  Ciprofloxacin 500 mg BID x 7 days   Loyce Dys, MS RD PA-C

## 2022-11-01 NOTE — H&P (Signed)
Chief Complaint: Patient was seen in consultation today for prostate artery embolization at the request of Dr Ranelle Oyster   Supervising Physician: Marliss Coots  Patient Status: [facility] - Out-pt  History of Present Illness: Ronnie Hardy is a 77 y.o. male   FULL Code Status per pt Known to IR Previous prostate artery embolization procedure performed 08/29/22  Medical history significant for benign prostatic hyperplasia with lower urinary tract symptoms (IPSS/QoL 12/4 --> 13/3) status post left prostate artery embolization on 08/29/22.  His right prostatic artery was unable to be cannulated from radial access due to supreme tortuosity of his aorta and pelvic vessels, however was visualized on angiogram arising from the proximal right obturator artery.  He states some subjective improvement in symptoms after left PAE, notably decreased urgency and nocturia.  I have offered treating the right side from an ipsilateral common femoral artery approach and he is amenable and wishes to proceed.   He is scheduled for Right prostate artery embolization in IR today  Past Medical History:  Diagnosis Date   Arthritis    "all over"   Asthma    GERD (gastroesophageal reflux disease)    Hiatal hernia    Hypertension    IBS (irritable bowel syndrome)    PONV (postoperative nausea and vomiting)    Postoperative ileus (HCC) 05/22/2014   S/P back OR 05/19/2014    Past Surgical History:  Procedure Laterality Date   ACHILLES TENDON REPAIR Left 1990's   BACK SURGERY     ELBOW SURGERY Bilateral 2000's   "scoped them and took out bone chips"   EYE SURGERY Left 1980's   "removed splinters"   IR ANGIOGRAM PELVIS SELECTIVE OR SUPRASELECTIVE  08/29/2022   IR EMBO ARTERIAL NOT HEMORR HEMANG INC GUIDE ROADMAPPING  08/29/2022   IR RADIOLOGIST EVAL & MGMT  08/02/2022   IR RADIOLOGIST EVAL & MGMT  10/03/2022   IR US GUIDE VASC ACCESS LEFT  08/29/2022   IR US GUIDE VASC ACCESS LEFT  08/29/2022   LEFT HEART CATH  AND CORONARY ANGIOGRAPHY N/A 06/05/2020   Procedure: LEFT HEART CATH AND CORONARY ANGIOGRAPHY;  Surgeon: Kathleene Hazel, MD;  Location: [facility] INV[facility];  Service: Cardiovascular;  Laterality: N/A;   POSTERIOR FUSION LUMBAR SPINE, MINIMALLY INVASIVE  04/20/2014   ROTATOR CUFF REPAIR Left 2002   SHOULDER ARTHROSCOPY W/ ROTATOR CUFF REPAIR Right 2011   SHOULDER OPEN ROTATOR CUFF REPAIR Left 1990's; 2000's   TONSILLECTOMY  1954    Allergies: Meperidine and Anesthetics, amide  Medications: Prior to Admission medications   Medication Sig Start Date End Date Taking? Authorizing Provider  B Complex Vitamins (VITAMIN-B COMPLEX) TABS Take by mouth as directed.    Yes [provider]  budesonide-formoterol (SYMBICORT) 160-4.5 MCG/ACT inhaler Inhale 2 puffs into the lungs as needed.   Yes [provider]  dutasteride (AVODART) 0.5 MG capsule Take 0.5 mg by mouth daily.   Yes [provider]  Eszopiclone 3 MG TABS Take 3 mg by mouth at bedtime. Take immediately before bedtime   Yes [provider]  famotidine (PEPCID) 10 MG tablet Take 10 mg by mouth 2 (two) times daily. For heartburn   Yes [provider]  MAGNESIUM PO Take 1 tablet by mouth daily.   Yes [provider]  Multiple Vitamin (MULTI-VITAMINS) TABS Take 1 tablet by mouth daily.   Yes [provider]  olmesartan (BENICAR) 5 MG tablet Take 5 mg by mouth daily.   Yes [provider]  Omega-3 1000 MG CAPS Take 1 capsule by mouth daily.    Yes [provider]  tamsulosin (FLOMAX) 0.4 MG CAPS capsule Take 1 capsule by mouth as needed. 03/17/20  Yes [provider]  albuterol (PROVENTIL) (2.5 MG/3ML) 0.083% nebulizer solution Take 3 mLs (2.5 mg total) by nebulization every 6 (six) hours as needed for wheezing or shortness of breath. 09/20/17   Bevelyn Ngo, NP  methylPREDNISolone (MEDROL DOSEPAK) 4 MG TBPK tablet Please dispense Medrol tapered dose x  6 days 08/30/22   Mickie Kay, NP  rosuvastatin (CRESTOR) 5 MG tablet Take 1 tablet (5 mg total) by mouth 3 (three) times a week. Patient not taking: Reported on 10/04/2022 10/25/17   Nahser, Deloris Ping, MD  sildenafil (REVATIO) 20 MG tablet Take 1 tablet by mouth as needed.    [provider]     Family History  Problem Relation Age of Onset   Heart disease Mother    Stroke Mother    Brain cancer Mother    Heart disease Father    Alzheimer's disease Brother    Asthma Brother    Asthma Daughter     Social History   Socioeconomic History   Marital status: Married    Spouse name: Not on file   Number of children: Not on file   Years of education: Not on file   Highest education level: Not on file  Occupational History   Not on file  Tobacco Use   Smoking status: Never    Passive exposure: Yes   Smokeless tobacco: Never   Tobacco comments:    Father & Mother when he was a child.   Vaping Use   Vaping status: Never Used  Substance and Sexual Activity   Alcohol use: Yes    Comment: 05/22/2014 "a drink q 2 wk or so"   Drug use: No   Sexual activity: Not Currently  Other Topics Concern   Not on file  Social History Narrative   [facility] (07/21/16):   Originally from [facility]. He has mostly lived in [place]. Has traveled to [place] to visit relatives. 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 No bird exposure. [place]. Rarely ever uses his hot tub. 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 Also enjoys gardening. Does have probable asbestos exposure.    Social Determinants of Health   Financial Resource Strain: Not on file  Food Insecurity: Not on file  Transportation Needs: Not on file  Physical Activity: Not on file  Stress: Not on file  Social Connections: Not on file    Review of Systems: A 12 point ROS discussed and pertinent positives are  indicated in the HPI above.  All other systems are negative.  Review of Systems  Constitutional:  Negative for activity change, fatigue and fever.  Respiratory:  Negative for cough and shortness of breath.   Cardiovascular:  Negative for chest pain.  Gastrointestinal:  Negative for abdominal pain.  Musculoskeletal:  Negative for back pain.  Psychiatric/Behavioral:  Negative for behavioral problems and confusion.     Vital Signs: BP (!) 173/87 Comment: RN notified  Pulse 60   Temp 98.1 F (36.7 C) (Temporal)   Resp 14   Ht 5\' 10"  (1.778 m)   Wt 170 lb (77.1 kg)   SpO2 96%   BMI 24.39 kg/m  Advance Care Plan: The advanced care plan/surrogate decision maker was discussed at the time of visit and documented in the medical record.    Physical Exam Vitals reviewed.  HENT:     Mouth/Throat:     Mouth: Mucous membranes are moist.  Cardiovascular:     Rate and Rhythm: Normal rate and regular rhythm.     Heart sounds: Normal heart sounds.  Pulmonary:     Effort: Pulmonary effort is normal.     Breath sounds: Normal breath sounds. No wheezing.  Abdominal:     Palpations: Abdomen is soft.     Tenderness: There is no abdominal tenderness.  Musculoskeletal:        General: Normal range of motion.     Right lower leg: No edema.     Left lower leg: No edema.  Skin:    General: Skin is warm.  Neurological:     Mental Status: He is alert and oriented to person, place, and time.  Psychiatric:        Behavior: Behavior normal.     Imaging: IR Radiologist Eval & Mgmt  Result Date: 10/03/2022 EXAM: ESTABLISHED PATIENT OFFICE VISIT CHIEF COMPLAINT: See Epic note. HISTORY OF PRESENT ILLNESS: See Epic note. REVIEW OF SYSTEMS: See Epic note. PHYSICAL EXAMINATION: See Epic note. ASSESSMENT AND PLAN: See Epic note. Marliss Coots, MD [facility] Electronically Signed   By: Marliss Coots M.D.   On: 10/03/2022 11:58     Labs:  CBC: Recent Labs    08/29/22 0856 11/01/22 0711  WBC 5.8 4.3  HGB 13.1 14.1  HCT 38.4* 41.6  PLT 188 175    COAGS: Recent Labs    08/29/22 0856 11/01/22 0711  INR 1.0 1.0    BMP: Recent Labs    08/29/22 0856  NA 132*  K 3.6  CL 99  CO2 26  GLUCOSE 86  BUN 12  CALCIUM 8.8*  CREATININE 0.74  GFRNONAA >60    LIVER FUNCTION TESTS: No results for input(s): "BILITOT", "AST", "ALT", "ALKPHOS", "PROT", "ALBUMIN" in the last 8760 hours.  TUMOR MARKERS: No results for input(s): "AFPTM", "CEA", "CA199", "CHROMGRNA" in the last 8760 hours.  Assessment and Plan:  Scheduled for Right prostate artery embolization Risks and benefits of right prostate artery embolization were discussed with the patient including, but not limited to bleeding, infection, vascular injury or contrast induced renal failure.  This interventional procedure involves the use of X-rays and because of the nature of the planned procedure, it is possible that we will have prolonged use of X-ray fluoroscopy.  Potential radiation risks to you include (but are not limited to) the following: - A slightly elevated risk for cancer  several years later in life. This risk is typically less than 0.5% percent. This risk is low in comparison to the normal incidence of human cancer, which is 33% for women and 50% for men according to the American Cancer Society. - Radiation induced injury can include skin redness, resembling a rash, tissue breakdown / ulcers and hair loss (which can be temporary or permanent).   The likelihood of either of these occurring depends on the difficulty of the procedure and whether you are sensitive to radiation due to previous procedures, disease, or genetic conditions.   IF your procedure requires a prolonged use of radiation, you will be notified and given written instructions for further action.  It is your responsibility to monitor the irradiated area for the 2 weeks  following the procedure and to notify your physician if you are concerned that you have suffered a radiation induced injury.    All of the patient's questions were answered, patient is agreeable to proceed.  Consent signed and in chart.  Thank you for this interesting consult.  I greatly enjoyed meeting Wen Goicoechea and look forward to participating in their care.  A copy of this report was sent to the requesting provider on this date.  Electronically Signed: Robet Leu, PA-C 11/01/2022, 7:33 AM   I spent a total of    25 Minutes in face to face in clinical consultation, greater than 50% of which was counseling/coordinating care for right prostate artery embolization

## 2022-11-01 NOTE — Procedures (Signed)
Interventional Radiology Procedure Note  Procedure: Right prostate artery embolization  Findings: Please refer to procedural dictation for full description. Right CFA access, 8 Fr Angioseal closure.  Complications: None immediate  Estimated Blood Loss: < 5 mL  Recommendations: -strict 2 hour bedrest, 1 hour flat, 1 hour head of bed up to 20 degrees. -medrol dosepak x 5 days  -solifenacin 5 mg QD x 7 days  -phenazopyridine 100 mg TID x 7 days  -ciprofloxacin 500 mg BID x 7 days  -Follow up in IR clinic in 1 month  Marliss Coots, MD

## 2022-11-01 NOTE — Progress Notes (Signed)
Patient was given discharge instructions. He verbalized understanding. 

## 2022-11-03 DIAGNOSIS — B078 Other viral warts: Secondary | ICD-10-CM | POA: Diagnosis not present

## 2022-11-03 DIAGNOSIS — R350 Frequency of micturition: Secondary | ICD-10-CM | POA: Diagnosis not present

## 2022-11-03 DIAGNOSIS — L308 Other specified dermatitis: Secondary | ICD-10-CM | POA: Diagnosis not present

## 2022-11-03 DIAGNOSIS — R3915 Urgency of urination: Secondary | ICD-10-CM | POA: Diagnosis not present

## 2022-11-09 DIAGNOSIS — S39012D Strain of muscle, fascia and tendon of lower back, subsequent encounter: Secondary | ICD-10-CM | POA: Diagnosis not present

## 2022-11-15 ENCOUNTER — Other Ambulatory Visit: Payer: Self-pay | Admitting: Interventional Radiology

## 2022-11-15 DIAGNOSIS — S39012D Strain of muscle, fascia and tendon of lower back, subsequent encounter: Secondary | ICD-10-CM | POA: Diagnosis not present

## 2022-11-15 DIAGNOSIS — N400 Enlarged prostate without lower urinary tract symptoms: Secondary | ICD-10-CM

## 2022-11-17 DIAGNOSIS — S39012D Strain of muscle, fascia and tendon of lower back, subsequent encounter: Secondary | ICD-10-CM | POA: Diagnosis not present

## 2022-11-24 DIAGNOSIS — S39012D Strain of muscle, fascia and tendon of lower back, subsequent encounter: Secondary | ICD-10-CM | POA: Diagnosis not present

## 2022-11-28 NOTE — Progress Notes (Signed)
Referring Physician(s): Dr. Bjorn Pippin  Chief Complaint: The patient is seen in virtual follow up today s/p prostate artery embolization   History of present illness: HPI from last follow up 10/03/22 Ronnie Hardy is a 77 y.o. male with a medical history significant for HTN, IBS, back pain, erectile dysfunction, elevated PSA and benign prostatic hyperplasia with lower urinary tract symptoms. He's met with his Urologist several times this year for treatment of his LUTS with complaints of urinary urgency, frequency and nocturia. He has had these symptoms for the last several years with some progression and has tried several medications over the years with variable success.  He has had past issues with incomplete bladder emptying and a weak urinary stream. He is currently on tamsulosin and tadalafil daily with PRN use of sildenafil and his current IPSS score is 17. A TRUS obtained at the Urology office showed his prostate measuring 104 ml.  Dr. Annabell Howells discussed multiple treatment options including prostatectomy, HoLEP, Aquablation and PAE.   The patient was interested in pursuing minimally invasive options as opposed to surgery. He was referred to Interventional Radiology to discuss prostate artery embolization and I met with him 08/02/22. He was found to be an excellent candidate for this procedure and this was performed 08/29/22. He tolerated the procedure well and he was discharged home the same day. He experienced some post-procedure urinary retention and was prescribed a Medrol Dose Pack.   Ronnie Hardy presents today via virtual telephone visit for follow up. He states that he is slightly better at night with nocturia, still experiencing 2-3x.  Urgency has improved.  With the level of symptoms he was still experiencing I offered to treat the right side and he was interested in this option. He was seen 11/01/22 for right prostate artery embolization and was discharged home the same day. He presents  today via virtual telephone visit for follow up.   No changes since prior to embolization.  He reports still getting up about 1-2x per night, which is improved from the past when he experienced 5-6x nocturia.  He has persistent feelings of urgency.  He is still taking flomax.  He sees Dr. Annabell Howells again this Fall.    Past Medical History:  Diagnosis Date   Arthritis    "all over"   Asthma    GERD (gastroesophageal reflux disease)    Hiatal hernia    Hypertension    IBS (irritable bowel syndrome)    PONV (postoperative nausea and vomiting)    Postoperative ileus (HCC) 05/22/2014   S/P back OR 05/19/2014    Past Surgical History:  Procedure Laterality Date   ACHILLES TENDON REPAIR Left 1990's   BACK SURGERY     ELBOW SURGERY Bilateral 2000's   "scoped them and took out bone chips"   EYE SURGERY Left 1980's   "removed splinters"   IR ANGIOGRAM PELVIS SELECTIVE OR SUPRASELECTIVE  08/29/2022   IR ANGIOGRAM PELVIS SELECTIVE OR SUPRASELECTIVE  11/01/2022   IR ANGIOGRAM SELECTIVE EACH ADDITIONAL VESSEL  11/01/2022   IR ANGIOGRAM SELECTIVE EACH ADDITIONAL VESSEL  11/01/2022   IR EMBO ARTERIAL NOT HEMORR HEMANG INC GUIDE ROADMAPPING  08/29/2022   IR EMBO ARTERIAL NOT HEMORR HEMANG INC GUIDE ROADMAPPING  11/01/2022   IR RADIOLOGIST EVAL & MGMT  08/02/2022   IR RADIOLOGIST EVAL & MGMT  10/03/2022   IR US GUIDE VASC ACCESS LEFT  08/29/2022   IR US GUIDE VASC ACCESS LEFT  08/29/2022   IR US GUIDE VASC  ACCESS LEFT  11/01/2022   IR US GUIDE VASC ACCESS RIGHT  11/01/2022   LEFT HEART CATH AND CORONARY ANGIOGRAPHY N/A 06/05/2020   Procedure: LEFT HEART CATH AND CORONARY ANGIOGRAPHY;  Surgeon: Kathleene Hazel, MD;  Location: [facility];  Service: Cardiovascular;  Laterality: N/A;   POSTERIOR FUSION LUMBAR SPINE, MINIMALLY INVASIVE  04/20/2014   ROTATOR CUFF REPAIR Left 2002   SHOULDER ARTHROSCOPY W/ ROTATOR CUFF REPAIR Right 2011   SHOULDER OPEN ROTATOR CUFF REPAIR Left 1990's; 2000's    TONSILLECTOMY  1954    Allergies: Meperidine and Anesthetics, amide  Medications: Prior to Admission medications   Medication Sig Start Date End Date Taking? Authorizing Provider  albuterol (PROVENTIL) (2.5 MG/3ML) 0.083% nebulizer solution Take 3 mLs (2.5 mg total) by nebulization every 6 (six) hours as needed for wheezing or shortness of breath. 09/20/17   Bevelyn Ngo, NP  B Complex Vitamins (VITAMIN-B COMPLEX) TABS Take by mouth as directed.     [provider]  budesonide-formoterol (SYMBICORT) 160-4.5 MCG/ACT inhaler Inhale 2 puffs into the lungs as needed.    [provider]  dutasteride (AVODART) 0.5 MG capsule Take 0.5 mg by mouth daily.    [provider]  Eszopiclone 3 MG TABS Take 3 mg by mouth at bedtime. Take immediately before bedtime    [provider]  famotidine (PEPCID) 10 MG tablet Take 10 mg by mouth 2 (two) times daily. For heartburn    [provider]  MAGNESIUM PO Take 1 tablet by mouth daily.    [provider]  methylPREDNISolone (MEDROL DOSEPAK) 4 MG TBPK tablet Please dispense Medrol tapered dose x 6 days 08/30/22   Mickie Kay, NP  Multiple Vitamin (MULTI-VITAMINS) TABS Take 1 tablet by mouth daily.    [provider]  olmesartan (BENICAR) 5 MG tablet Take 5 mg by mouth daily.    [provider]  Omega-3 1000 MG CAPS Take 1 capsule by mouth daily.     [provider]  rosuvastatin (CRESTOR) 5 MG tablet Take 1 tablet (5 mg total) by mouth 3 (three) times a week. Patient not taking: Reported on 10/04/2022 10/25/17   Nahser, Deloris Ping, MD  sildenafil (REVATIO) 20 MG tablet Take 1 tablet by mouth as needed.    [provider]  tamsulosin (FLOMAX) 0.4 MG CAPS capsule Take 1 capsule by mouth as needed. 03/17/20   [provider]     Family History  Problem Relation Age of Onset   Heart disease Mother    Stroke Mother    Brain cancer Mother    Heart disease Father     Alzheimer's disease Brother    Asthma Brother    Asthma Daughter     Social History   Socioeconomic History   Marital status: Married    Spouse name: Not on file   Number of children: Not on file   Years of education: Not on file   Highest education level: Not on file  Occupational History   Not on file  Tobacco Use   Smoking status: Never    Passive exposure: Yes   Smokeless tobacco: Never   Tobacco comments:    Father & Mother when he was a child.   Vaping Use   Vaping status: Never Used  Substance and Sexual Activity   Alcohol use: Yes    Comment: 05/22/2014 "a drink q 2 wk or so"   Drug use: No   Sexual activity: Not  Currently  Other Topics Concern   Not on file  Social History Narrative   [facility] (07/21/16):   Originally from [facility]. He has mostly lived in [place]. Has traveled to [place] to visit relatives. 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 No bird exposure. Previously had mold in a [place] that he [place] previously. Rarely ever uses his hot tub. 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 Also enjoys gardening. Does have probable asbestos exposure.    Social Determinants of Health   Financial Resource Strain: Not on file  Food Insecurity: Not on file  Transportation Needs: Not on file  Physical Activity: Not on file  Stress: Not on file  Social Connections: Not on file     Vital Signs: There were no vitals taken for this visit.  No physical exam was performed in lieu of virtual telephone visit.   Imaging: CTA 08/16/22  R PA origin.     PAE 08/29/22  Right PA ostium arising from proximal obturator.    Left PA - pre embolization    Left PA - post embolization    Right PAE 11/01/22   Labs:  CBC: Recent Labs    08/29/22 0856 11/01/22 0711  WBC 5.8 4.3  HGB 13.1 14.1  HCT 38.4* 41.6  PLT 188 175    COAGS: Recent Labs    08/29/22 0856 11/01/22 0711  INR 1.0 1.0     BMP: Recent Labs    08/29/22 0856 11/01/22 0711  NA 132* 131*  K 3.6 4.1  CL 99 99  CO2 26 21*  GLUCOSE 86 94  BUN 12 14  CALCIUM 8.8* 9.0  CREATININE 0.74 0.74  GFRNONAA >60 >60    LIVER FUNCTION TESTS: No results for input(s): "BILITOT", "AST", "ALT", "ALKPHOS", "PROT", "ALBUMIN" in the last 8760 hours.  Assessment and Plan: Ronnie Hardy is a 77 y.o. male with a medical history significant for benign prostatic hyperplasia with lower urinary tract symptoms (IPSS/QoL 12/4 --> 13/3) status post left prostate artery embolization on 08/29/22 and right artery embolization 10/31/25.  Unfortunately, he has experienced no changes in his lower urinary tract symptoms since the 2nd embolization.  He is continuing to take Flomax.  -continue follow up with Urology -Follow up with IR as needed    Marliss Coots, MD Pager: [phone]    I spent a total of 25 Minutes in virtual clinical consultation, greater than 50% of which was counseling/coordinating care for benign prostatic hyperplasia.

## 2022-11-29 DIAGNOSIS — S39012D Strain of muscle, fascia and tendon of lower back, subsequent encounter: Secondary | ICD-10-CM | POA: Diagnosis not present

## 2022-11-29 DIAGNOSIS — Z9889 Other specified postprocedural states: Secondary | ICD-10-CM | POA: Diagnosis not present

## 2022-11-30 ENCOUNTER — Ambulatory Visit
Admission: RE | Admit: 2022-11-30 | Discharge: 2022-11-30 | Disposition: A | Discharge status: Home / Self Care | Payer: Medicare Other | Source: Ambulatory Visit | Attending: Interventional Radiology | Admitting: Interventional Radiology

## 2022-11-30 DIAGNOSIS — N400 Enlarged prostate without lower urinary tract symptoms: Secondary | ICD-10-CM

## 2022-11-30 DIAGNOSIS — R3915 Urgency of urination: Secondary | ICD-10-CM | POA: Diagnosis not present

## 2022-11-30 HISTORY — PX: IR RADIOLOGIST EVAL & MGMT: IMG5224

## 2022-12-05 DIAGNOSIS — S39012D Strain of muscle, fascia and tendon of lower back, subsequent encounter: Secondary | ICD-10-CM | POA: Diagnosis not present

## 2022-12-13 DIAGNOSIS — S39012D Strain of muscle, fascia and tendon of lower back, subsequent encounter: Secondary | ICD-10-CM | POA: Diagnosis not present

## 2022-12-15 DIAGNOSIS — S39012D Strain of muscle, fascia and tendon of lower back, subsequent encounter: Secondary | ICD-10-CM | POA: Diagnosis not present

## 2022-12-27 DIAGNOSIS — M545 Low back pain, unspecified: Secondary | ICD-10-CM | POA: Diagnosis not present

## 2023-01-09 DIAGNOSIS — M545 Low back pain, unspecified: Secondary | ICD-10-CM | POA: Diagnosis not present

## 2023-01-11 DIAGNOSIS — M4328 Fusion of spine, sacral and sacrococcygeal region: Secondary | ICD-10-CM | POA: Diagnosis not present

## 2023-01-11 DIAGNOSIS — S39012D Strain of muscle, fascia and tendon of lower back, subsequent encounter: Secondary | ICD-10-CM | POA: Diagnosis not present

## 2023-01-18 DIAGNOSIS — S39012D Strain of muscle, fascia and tendon of lower back, subsequent encounter: Secondary | ICD-10-CM | POA: Diagnosis not present

## 2023-01-18 DIAGNOSIS — M4328 Fusion of spine, sacral and sacrococcygeal region: Secondary | ICD-10-CM | POA: Diagnosis not present

## 2023-03-07 DIAGNOSIS — M85851 Other specified disorders of bone density and structure, right thigh: Secondary | ICD-10-CM | POA: Diagnosis not present

## 2023-03-07 DIAGNOSIS — M8589 Other specified disorders of bone density and structure, multiple sites: Secondary | ICD-10-CM | POA: Diagnosis not present

## 2023-03-07 DIAGNOSIS — M85852 Other specified disorders of bone density and structure, left thigh: Secondary | ICD-10-CM | POA: Diagnosis not present

## 2023-03-16 DIAGNOSIS — R140 Abdominal distension (gaseous): Secondary | ICD-10-CM | POA: Diagnosis not present

## 2023-03-16 DIAGNOSIS — K5904 Chronic idiopathic constipation: Secondary | ICD-10-CM | POA: Diagnosis not present

## 2023-03-16 DIAGNOSIS — K219 Gastro-esophageal reflux disease without esophagitis: Secondary | ICD-10-CM | POA: Diagnosis not present

## 2023-03-16 DIAGNOSIS — I10 Essential (primary) hypertension: Secondary | ICD-10-CM | POA: Diagnosis not present

## 2023-04-05 DIAGNOSIS — K080 Exfoliation of teeth due to systemic causes: Secondary | ICD-10-CM | POA: Diagnosis not present

## 2023-04-28 ENCOUNTER — Other Ambulatory Visit: Payer: Self-pay | Admitting: Orthopedic Surgery

## 2023-04-28 DIAGNOSIS — M545 Low back pain, unspecified: Secondary | ICD-10-CM | POA: Diagnosis not present

## 2023-04-28 DIAGNOSIS — M5416 Radiculopathy, lumbar region: Secondary | ICD-10-CM

## 2023-05-09 NOTE — Discharge Instructions (Signed)

## 2023-05-10 ENCOUNTER — Ambulatory Visit
Admission: RE | Admit: 2023-05-10 | Discharge: 2023-05-10 | Disposition: A | Discharge status: Home / Self Care | Payer: Medicare Other | Source: Ambulatory Visit | Attending: Orthopedic Surgery | Admitting: Orthopedic Surgery

## 2023-05-10 DIAGNOSIS — M5416 Radiculopathy, lumbar region: Secondary | ICD-10-CM

## 2023-05-10 DIAGNOSIS — M4727 Other spondylosis with radiculopathy, lumbosacral region: Secondary | ICD-10-CM | POA: Diagnosis not present

## 2023-05-10 MED ORDER — IOPAMIDOL (ISOVUE-M 200) INJECTION 41%
1.0000 mL | Freq: Once | INTRAMUSCULAR | Status: AC
  Administered 2023-05-10: 1 mL via EPIDURAL

## 2023-05-10 MED ORDER — METHYLPREDNISOLONE ACETATE 40 MG/ML INJ SUSP (RADIOLOG
80.0000 mg | Freq: Once | INTRAMUSCULAR | Status: AC
  Administered 2023-05-10: 80 mg via EPIDURAL

## 2023-06-06 DIAGNOSIS — N401 Enlarged prostate with lower urinary tract symptoms: Secondary | ICD-10-CM | POA: Diagnosis not present

## 2023-06-06 DIAGNOSIS — R3915 Urgency of urination: Secondary | ICD-10-CM | POA: Diagnosis not present

## 2023-06-06 DIAGNOSIS — R351 Nocturia: Secondary | ICD-10-CM | POA: Diagnosis not present

## 2023-06-06 DIAGNOSIS — N5201 Erectile dysfunction due to arterial insufficiency: Secondary | ICD-10-CM | POA: Diagnosis not present

## 2023-07-03 DIAGNOSIS — F5104 Psychophysiologic insomnia: Secondary | ICD-10-CM | POA: Diagnosis not present

## 2023-07-03 DIAGNOSIS — R0602 Shortness of breath: Secondary | ICD-10-CM | POA: Diagnosis not present

## 2023-07-14 DIAGNOSIS — M545 Low back pain, unspecified: Secondary | ICD-10-CM | POA: Diagnosis not present

## 2023-08-07 DIAGNOSIS — M47816 Spondylosis without myelopathy or radiculopathy, lumbar region: Secondary | ICD-10-CM | POA: Diagnosis not present

## 2023-08-15 DIAGNOSIS — Z125 Encounter for screening for malignant neoplasm of prostate: Secondary | ICD-10-CM | POA: Diagnosis not present

## 2023-08-15 DIAGNOSIS — I10 Essential (primary) hypertension: Secondary | ICD-10-CM | POA: Diagnosis not present

## 2023-08-22 DIAGNOSIS — I10 Essential (primary) hypertension: Secondary | ICD-10-CM | POA: Diagnosis not present

## 2023-08-22 DIAGNOSIS — R918 Other nonspecific abnormal finding of lung field: Secondary | ICD-10-CM | POA: Diagnosis not present

## 2023-08-22 DIAGNOSIS — K219 Gastro-esophageal reflux disease without esophagitis: Secondary | ICD-10-CM | POA: Diagnosis not present

## 2023-08-22 DIAGNOSIS — B351 Tinea unguium: Secondary | ICD-10-CM | POA: Diagnosis not present

## 2023-08-22 DIAGNOSIS — M48061 Spinal stenosis, lumbar region without neurogenic claudication: Secondary | ICD-10-CM | POA: Diagnosis not present

## 2023-08-22 DIAGNOSIS — E871 Hypo-osmolality and hyponatremia: Secondary | ICD-10-CM | POA: Diagnosis not present

## 2023-08-22 DIAGNOSIS — Z0000 Encounter for general adult medical examination without abnormal findings: Secondary | ICD-10-CM | POA: Diagnosis not present

## 2023-08-29 DIAGNOSIS — M47816 Spondylosis without myelopathy or radiculopathy, lumbar region: Secondary | ICD-10-CM | POA: Diagnosis not present

## 2023-09-05 DIAGNOSIS — M48061 Spinal stenosis, lumbar region without neurogenic claudication: Secondary | ICD-10-CM | POA: Diagnosis not present

## 2023-09-21 DIAGNOSIS — E871 Hypo-osmolality and hyponatremia: Secondary | ICD-10-CM | POA: Diagnosis not present

## 2023-09-27 DIAGNOSIS — I2089 Other forms of angina pectoris: Secondary | ICD-10-CM | POA: Diagnosis not present

## 2023-09-27 DIAGNOSIS — I447 Left bundle-branch block, unspecified: Secondary | ICD-10-CM | POA: Diagnosis not present

## 2023-09-27 DIAGNOSIS — R0609 Other forms of dyspnea: Secondary | ICD-10-CM | POA: Diagnosis not present

## 2023-09-27 DIAGNOSIS — I440 Atrioventricular block, first degree: Secondary | ICD-10-CM | POA: Diagnosis not present

## 2023-09-28 ENCOUNTER — Ambulatory Visit: Attending: Cardiology | Admitting: Cardiology

## 2023-09-28 ENCOUNTER — Encounter: Payer: Self-pay | Admitting: Cardiology

## 2023-09-28 VITALS — BP 138/76 | HR 55 | Ht 70.0 in | Wt 165.0 lb

## 2023-09-28 DIAGNOSIS — I10 Essential (primary) hypertension: Secondary | ICD-10-CM

## 2023-09-28 DIAGNOSIS — I447 Left bundle-branch block, unspecified: Secondary | ICD-10-CM

## 2023-09-28 DIAGNOSIS — I2510 Atherosclerotic heart disease of native coronary artery without angina pectoris: Secondary | ICD-10-CM | POA: Diagnosis not present

## 2023-09-28 DIAGNOSIS — R0602 Shortness of breath: Secondary | ICD-10-CM

## 2023-09-28 NOTE — Patient Instructions (Signed)
 Medication Instructions:  No changes *If you need a refill on your cardiac medications before your next appointment, please call your pharmacy*  Lab Work: Today we are going to have you stop on the first floor for a TSH If you have labs (blood work) drawn today and your tests are completely normal, you will receive your results only by: MyChart Message (if you have MyChart) OR A paper copy in the mail If you have any lab test that is abnormal or we need to change your treatment, we will call you to review the results.  Testing/Procedures: Your physician has requested that you have an echocardiogram. Echocardiography is a painless test that uses sound waves to create images of your heart. It provides your doctor with information about the size and shape of your heart and how well your heart's chambers and valves are working. This procedure takes approximately one hour. There are no restrictions for this procedure. Please do NOT wear cologne, perfume, aftershave, or lotions (deodorant is allowed). Please arrive 15 minutes prior to your appointment time.  Please note: We ask at that you not bring children with you during ultrasound (echo/ vascular) testing. Due to room size and safety concerns, children are not allowed in the ultrasound rooms during exams. Our front office staff cannot provide observation of children in our lobby area while testing is being conducted. An adult accompanying a patient to their appointment will only be allowed in the ultrasound room at the discretion of the ultrasound technician under special circumstances. We apologize for any inconvenience.  Follow-Up: At [facility], you and your health needs are our priority.  As part of our continuing mission to provide you with exceptional heart care, our providers are all part of one team.  This team includes your primary Cardiologist (physician) and Advanced Practice Providers or APPs (Physician Assistants and Nurse  Practitioners) who all work together to provide you with the care you need, when you need it.  Your next appointment:   3 month(s)  Provider:   Ozell Fell MD  We recommend signing up for the patient portal called MyChart.  Sign up information is provided on this After Visit Summary.  MyChart is used to connect with patients for Virtual Visits (Telemedicine).  Patients are able to view lab/test results, encounter notes, upcoming appointments, etc.  Non-urgent messages can be sent to your provider as well.   To learn more about what you can do with MyChart, go to [email or web address].

## 2023-09-28 NOTE — Progress Notes (Signed)
 Cardiology Office Note:  .   Date:  09/28/2023  ID:  Ronnie Hardy, DOB May 28, 1945, MRN [id number] PCP: Ronnie Nottingham, MD  [facility] Providers Cardiologist:  Ronnie Passe, MD (Inactive) {  History of Present Illness: Ronnie   [name] is a 78 y.o. 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     Mild nonobstructive CAD T wave inversion + CP, LHC 06/2020.  Diffuse 20 to 30% stenosis. Echocardiogram 09/2022, EF 55%.  Septal lateral dyssynchrony.  Moderate asymmetric LVH, no LVOT.  G2 DD. Mild to mod MR  Social history  Father had MI. Not a known diabetic.  Works as a Clinical research associate     Patient with history of chest pain with multiple unremarkable ischemic evaluations.  Last cath April 2022 with mild nonobstructive disease.  Last seen by Dr. Passe 09/2022 reporting no further instances of chest pain.  Today patient presents for follow-up, asked to see somewhat urgently given new complaints of shortness of breath.  He reports that for the past 3 weeks or so he has had significant shortness of breath when climbing up stairs.  In the room he is making audible breathing sounds but not exactly wheezing.  However still remains to be extremely active, goes to the gym 4+ times each week does weight training and elliptical for 30+ minutes each time.  Denies any chest pain, reports vague abdominal pain at times.  He reports he had similar constellation of symptoms in December when he felt that he had some viral infection however never got tested.  Today denies any viral symptoms such as congestion, cough, fever.  Admits to some fatigue.  He does tell me that he has had asthma since the age of 52.  Reports living [place] farm and been exposed to multiple environmental inhalants.  Used to be followed by pulmonologist for this however after his pulmonologist retired he was lost in follow-up and has not been seen  since.  Additionally has had history of pulmonary nodules that were being tracked very closely.  CT 10/2020 reports these been as unchanged and considered benign.   ROS: Denies: Chest pain, orthopnea, peripheral edema, palpitations, decreased exercise intolerance, fatigue, lightheadedness.   Studies Reviewed: Ronnie    EKG Interpretation Date/Time:  Thursday September 28 2023 11:43:58 EDT Ventricular Rate:  55 PR Interval:  234 QRS Duration:  170 QT Interval:  484 QTC Calculation: 463 R Axis:   27  Text Interpretation: Sinus bradycardia with 1st degree A-V block Left bundle branch block When compared with ECG of 04-Oct-2022 10:49, No significant change was found Confirmed by Darryle Currier [phone]) on 09/28/2023 11:51:09 AM    Risk Assessment/Calculations:             Physical Exam:   VS:  BP 138/76   Pulse (!) 55   Ht 5' 10 (1.778 m)   Wt 165 lb (74.8 kg)   SpO2 97%   BMI 23.68 kg/m    Wt Readings from Last 3 Encounters:  09/28/23 165 lb (74.8 kg)  11/01/22 170 lb (77.1 kg)  10/04/22 170 lb 9.6 oz (77.4 kg)    GEN: Well nourished, well developed in no acute distress NECK: No JVD; No carotid bruits CARDIAC: RRR, no murmurs, rubs, gallops RESPIRATORY:  Clear to auscultation without rales, wheezing or rhonchi  ABDOMEN: Soft, non-tender, non-distended EXTREMITIES:  No edema; No deformity   ASSESSMENT AND PLAN: .  Shortness of breath Asthma Etiology of this does not seem to be related to any cardiac related causes.  Reports worsening shortness of breath although still has very impressive functional capacity.  He looks euvolemic and with overall normal echocardiogram 09/2022, G2 DD, mild to moderate MR.  Progression of CAD seems highly unlikely given only mild 20 to 30% stenosis in his coronaries in 2022.  Not likely to have rapid progression in the last 3 years.  Also denies any complaints of chest pain.  I think given his history of asthma, audible increased work of breathing, and  pulmonary nodules that further evaluation with pulmonologist seems more relevant. Although low suspicion, will order echocardiogram to evaluate further progression of valvular disease.  No murmur appreciated today. Recommend pulmonology evaluation to consider PFTs.  Does admit to [place] exposures when he was a kid inhaling a lot of smoke and dust.  Previously seen by [facility]. Ordering TSH Reviewed recent lab work from his primary care doctor.  There is no evidence of anemia, significant electrolyte imbalances.  Does have very mild hyponatremia, not sure if this is corresponding to anything significant. If no convincing workup then maybe would consider repeating ischemic evaluation.  Nonobstructive CAD Hyperlipidemia LBBB - LHC 06/2020, diffuse 20 to 30% stenosis EKG does not show any acute ST-T wave changes, left bundle branch block noted that is chronic.  Negative Sgarbossa's criteria.  Do not feel symptoms above are related. He reports he is no longer taking rosuvastatin .  LDL is 73 June 2025       Dispo: 3 months with Dr. Wonda to establish care with new cardiologist.  And to review echocardiogram and hopefully has seen a pulmonologist by this point.  Signed, Ronnie LITTIE Sluder, PA-C

## 2023-09-29 ENCOUNTER — Ambulatory Visit: Payer: Self-pay | Admitting: Cardiology

## 2023-09-29 LAB — TSH: TSH: 1.51 u[IU]/mL (ref 0.450–4.500)

## 2023-10-05 DIAGNOSIS — E871 Hypo-osmolality and hyponatremia: Secondary | ICD-10-CM | POA: Diagnosis not present

## 2023-10-09 ENCOUNTER — Ambulatory Visit: Payer: Self-pay

## 2023-10-09 NOTE — Telephone Encounter (Signed)
 FYI Only or Action Required?: Action required by provider: request for appointment.  Patient is followed in Pulmonology for asthma/nodules, last seen on 2020 with CANDIE Lites NP.  Called Nurse Triage reporting Shortness of Breath and Referral.   Triage Disposition: See PCP Within 2 Weeks  Patient/caregiver understands and will follow disposition?: No, wishes to speak with PCP       Copied from CRM #[id number]. Topic: Clinical - Red Word Triage >> Oct 09, 2023  4:23 PM Joesph PARAS wrote: Red Word that prompted transfer to Nurse Triage: Severe breathing issues not related to asthma, not cardiology related. SOB especially when physically active, happens when up and walking as well, not happening when at rest. Denied chest pain, denies history of oxygen related disorders. Reason for Disposition  [1] MILD longstanding difficulty breathing (e.g., minimal/no SOB at rest, SOB with walking, pulse < 100) AND [2] SAME as normal  Answer Assessment - Initial Assessment Questions 1. RESPIRATORY STATUS: Describe your breathing? (e.g., wheezing, shortness of breath, unable to speak, severe coughing)      Sob, esp with exertion up stairs 2. ONSET: When did this breathing problem begin?      This past winter, self resolved with INH, then sx returned in June - has been following PCP and referred to cardiology and cleared, now thinking sx may be r.t lungs 3. PATTERN Does the difficult breathing come and go, or has it been constant since it started?      intermittent 4. SEVERITY: How bad is your breathing? (e.g., mild, moderate, severe)      Mild - sob with exertion Triager does not appreciate audible SOB/wheezing during call. Pt is speaking in full sentences.  5. RECURRENT SYMPTOM: Have you had difficulty breathing before? If Yes, ask: When was the last time? and What happened that time?      This past June 6. CARDIAC HISTORY: Do you have any history of heart disease? (e.g., heart attack,  angina, bypass surgery, angioplasty)      Denies Endorses last heart cath showed 20% Ca deposit in arteries 7. LUNG HISTORY: Do you have any history of lung disease?  (e.g., pulmonary embolus, asthma, emphysema)     Hx of nodules, previous pt with [facility] Albuterol  INH/NEB without relief Maintenance INH Advair 8. CAUSE: What do you think is causing the breathing problem?      Unknown, thinks pulm related 9. OTHER SYMPTOMS: Do you have any other symptoms? (e.g., chest pain, cough, dizziness, fever, runny nose)     Dizziness - occasional with SOB episodes 10. O2 SATURATION MONITOR:  Do you use an oxygen saturation monitor (pulse oximeter) at home? If Yes, ask: What is your reading (oxygen level) today? What is your usual oxygen saturation reading? (e.g., 95%)       Reports 97-98 11. PREGNANCY: Is there any chance you are pregnant? When was your last menstrual period?       N/a 12. TRAVEL: Have you traveled out of the country in the last month? (e.g., travel history, exposures)       N/a    Pt was previous [facility] pt but has not been seen in > 3 years d/t not being assigned a new Pulm MD. Pt reports has been following PCP to tx asthma, but sx seem to not be related to asthma and has concerns for nodule hx. Triager did advise pt to continue to follow PCP until he can be re-established with [facility].  Scheduled patient on the next available New  Pt appt on 11/16/2023 with Dr. Tamea but wishing to have earlier accommodations . Triager will forward encounter for [place], NP 's office to review.   Of note, pt already added to [facility]  Protocols used: Breathing Difficulty-A-AH

## 2023-10-10 DIAGNOSIS — K080 Exfoliation of teeth due to systemic causes: Secondary | ICD-10-CM | POA: Diagnosis not present

## 2023-10-26 ENCOUNTER — Encounter (HOSPITAL_BASED_OUTPATIENT_CLINIC_OR_DEPARTMENT_OTHER): Payer: Self-pay | Admitting: Pulmonary Disease

## 2023-10-26 ENCOUNTER — Ambulatory Visit (INDEPENDENT_AMBULATORY_CARE_PROVIDER_SITE_OTHER): Admitting: Pulmonary Disease

## 2023-10-26 ENCOUNTER — Ambulatory Visit (HOSPITAL_BASED_OUTPATIENT_CLINIC_OR_DEPARTMENT_OTHER)
Admission: RE | Admit: 2023-10-26 | Discharge: 2023-10-26 | Disposition: A | Discharge status: Home / Self Care | Source: Ambulatory Visit | Attending: Pulmonary Disease | Admitting: Pulmonary Disease

## 2023-10-26 VITALS — BP 144/80 | HR 56 | Ht 70.0 in | Wt 165.0 lb

## 2023-10-26 DIAGNOSIS — R0609 Other forms of dyspnea: Secondary | ICD-10-CM | POA: Diagnosis not present

## 2023-10-26 DIAGNOSIS — J4530 Mild persistent asthma, uncomplicated: Secondary | ICD-10-CM | POA: Diagnosis not present

## 2023-10-26 DIAGNOSIS — R918 Other nonspecific abnormal finding of lung field: Secondary | ICD-10-CM

## 2023-10-26 DIAGNOSIS — I771 Stricture of artery: Secondary | ICD-10-CM | POA: Diagnosis not present

## 2023-10-26 MED ORDER — FLUTICASONE-SALMETEROL 500-50 MCG/ACT IN AEPB
1.0000 | INHALATION_SPRAY | Freq: Two times a day (BID) | RESPIRATORY_TRACT | 6 refills | Status: DC
Start: 2023-10-26 — End: 2024-01-29

## 2023-10-26 NOTE — Assessment & Plan Note (Signed)
 Patient with adult onset asthma since the age of 25 years. He has been on ICS/LABA with [facility] for many years now. He notes episodic dyspnea on exertion especially with exposure to viral URIs from his 78 year old grandson. He also notes typical asthma triggers including pollen and changes in climate. He did self medicate with steroids and restarted ICS/LABA in the last month which improved his symptoms. I suspect that asthma is playing a role now. I recommended starting ICS/LABA but increased the dosage as well as albuterol  MDI 2 puffs every 4 hours as needed. I also recommended doing PFTs and CXR to rule out other lung pathology.

## 2023-10-26 NOTE — Progress Notes (Signed)
 New Patient Pulmonology Office Visit   Subjective:  Patient ID: Ronnie Hardy, male    DOB: 1945-07-29  MRN: [id number]  Referred by: Clarice Nottingham, MD  CC:  Chief Complaint  Patient presents with   Establish Care    HPI Ronnie Hardy is a 78 y.o. male with nonobstructive CAD by heart cath 06/2020, hypertension, LBBB,  s/p prostate artery embolization, asthma.   This winter he has a 23 year old grandson that lives with him. He had a LRTI suspect RSV or COVID. He got it and his wife got it. He started developing short of breath while climbing up stairs and it then went away after a few months. Now it came back. He gets acutely short of breath with going up steps. IT got better. Now gets short of breath at rest. He goes to the gym and works out with eliptical and stationary bike. He gets short winded with his normal exercise. In the last couple of weeks its getting a little bit better. The symptoms is fluctuating intermittently. Heat makes it worse and cooler is better. He is using Advair inhaler ever since this came on him. He also used albuterol  neb and MDI. Albuterol  did not help. He has been using Advair for at least a month and he feels slightly improved. He has chest tightness. Prednisone  10 mg x 5 days which was a week ago. No wheezing. No coughing. No phlegm. No chest pain. No heart racing. No orthopnea. No PND. No leg swelling.  He was diagnosed with asthma when he was 25 years. He was given nebulizers and singulairs. He has had advair handy and uses it with chest cold. Triggers: chest cold, pollen, smoking around.  Has two dogs  Usually needs prednisone  for asthma flares in winter.  PMH:  - DJD  PSH:  - Fusion 2016 - Last surgery July 2024  Medications: reviewed in chart  Allergies: hay fever  Social Hx: - Smoking never  - Second hand smoking: father smoked around him while growing up  - Vaping: none  - Alcohol: ocassional  - Illicit substances: none  -  Indoor emission from household combustion: none  - Hobbies (e.g. wood work, Surveyor, quantity, bird breeding etc...): none - Environmental (e.g. mold): none  - Pets 2 dogs  - Birds none   Occupational exposures: Lawyer [ ]  Asbestos - Holiday representative workers, Medical sales representative, Therapist, sports, railroad, Counsellor radiation - uranium mining  [ ]  Vinyl chloride - pulp & paper workers  [ ]  Arsenic - smelting of ores, pesticide application, Chief Strategy Officer  Beryllium - Archivist workers, Geophysical data processor, Pensions consultant workers, Health and safety inspector  [ ]  chromium - Financial trader, welding, tanning industries  [ ]  Nickel - Chief Strategy Officer, Naval architect, Physiological scientist, glass workers, Health and safety inspector, metal workers   Family Hx:  - Lung disease: asthma in son, daughter with asthma  - Cancer: maternal hx of brain cancer  ROS  Allergies: Meperidine and Anesthetics, amide  Current Outpatient Medications:    albuterol  (PROVENTIL ) (2.5 MG/3ML) 0.083% nebulizer solution, Take 3 mLs (2.5 mg total) by nebulization every 6 (six) hours as needed for wheezing or shortness of breath., Disp: 75 mL, Rfl: 2   B Complex Vitamins (VITAMIN-B COMPLEX) TABS, Take by mouth as directed. , Disp: , Rfl:    budesonide -formoterol  (SYMBICORT ) 160-4.5 MCG/ACT inhaler, Inhale 2 puffs into the lungs as needed., Disp: , Rfl:    Eszopiclone 3 MG TABS, Take 3 mg by  mouth at bedtime. Take immediately before bedtime, Disp: , Rfl:    famotidine  (PEPCID ) 10 MG tablet, Take 10 mg by mouth 2 (two) times daily. For heartburn, Disp: , Rfl:    fluticasone -salmeterol (ADVAIR) 500-50 MCG/ACT AEPB, Inhale 1 puff into the lungs in the morning and at bedtime., Disp: 1 each, Rfl: 6   MAGNESIUM PO, Take 1 tablet by mouth daily., Disp: , Rfl:    methylPREDNISolone  (MEDROL  DOSEPAK) 4 MG TBPK tablet, Please dispense Medrol  tapered dose x 6 days, Disp: 21 tablet, Rfl: 0   Multiple Vitamin (MULTI-VITAMINS) TABS, Take 1 tablet by mouth  daily., Disp: , Rfl:    olmesartan (BENICAR) 5 MG tablet, Take 5 mg by mouth daily., Disp: , Rfl:    Omega-3 1000 MG CAPS, Take 1 capsule by mouth daily. , Disp: , Rfl:    sildenafil (REVATIO) 20 MG tablet, Take 1 tablet by mouth as needed., Disp: , Rfl:    tamsulosin (FLOMAX) 0.4 MG CAPS capsule, Take 1 capsule by mouth as needed., Disp: , Rfl:  Past Medical History:  Diagnosis Date   Arthritis    all over   Asthma    GERD (gastroesophageal reflux disease)    Hiatal hernia    Hypertension    IBS (irritable bowel syndrome)    PONV (postoperative nausea and vomiting)    Postoperative ileus (HCC) 05/22/2014   S/P back OR 05/19/2014   Past Surgical History:  Procedure Laterality Date   ACHILLES TENDON REPAIR Left 1990's   BACK SURGERY     ELBOW SURGERY Bilateral 2000's   scoped them and took out bone chips   EYE SURGERY Left 1980's   removed splinters   IR ANGIOGRAM PELVIS SELECTIVE OR SUPRASELECTIVE  08/29/2022   IR ANGIOGRAM PELVIS SELECTIVE OR SUPRASELECTIVE  11/01/2022   IR ANGIOGRAM SELECTIVE EACH ADDITIONAL VESSEL  11/01/2022   IR ANGIOGRAM SELECTIVE EACH ADDITIONAL VESSEL  11/01/2022   IR EMBO ARTERIAL NOT HEMORR HEMANG INC GUIDE ROADMAPPING  08/29/2022   IR EMBO ARTERIAL NOT HEMORR HEMANG INC GUIDE ROADMAPPING  11/01/2022   IR RADIOLOGIST EVAL & MGMT  08/02/2022   IR RADIOLOGIST EVAL & MGMT  10/03/2022   IR RADIOLOGIST EVAL & MGMT  11/30/2022   IR US  GUIDE VASC ACCESS LEFT  08/29/2022   IR US  GUIDE VASC ACCESS LEFT  08/29/2022   IR US  GUIDE VASC ACCESS LEFT  11/01/2022   IR US  GUIDE VASC ACCESS RIGHT  11/01/2022   LEFT HEART CATH AND CORONARY ANGIOGRAPHY N/A 06/05/2020   Procedure: LEFT HEART CATH AND CORONARY ANGIOGRAPHY;  Surgeon: Verlin Lonni BIRCH, MD;  Location: [facility];  Service: Cardiovascular;  Laterality: N/A;   POSTERIOR FUSION LUMBAR SPINE, MINIMALLY INVASIVE  04/20/2014   ROTATOR CUFF REPAIR Left 2002   SHOULDER ARTHROSCOPY W/ ROTATOR CUFF REPAIR Right  2011   SHOULDER OPEN ROTATOR CUFF REPAIR Left 1990's; 2000's   TONSILLECTOMY  1954   Family History  Problem Relation Age of Onset   Heart disease Mother    Stroke Mother    Brain cancer Mother    Heart disease Father    Alzheimer's disease Brother    Asthma Brother    Asthma Daughter    Social History   Socioeconomic History   Marital status: Married    Spouse name: Not on file   Number of children: Not on file   Years of education: Not on file   Highest education level: Not on file  Occupational History  Not on file  Tobacco Use   Smoking status: Never    Passive exposure: Yes   Smokeless tobacco: Never   Tobacco comments:    Father & Mother when he was a child.   Vaping Use   Vaping status: Never Used  Substance and Sexual Activity   Alcohol use: Yes    Comment: 05/22/2014 a drink q 2 wk or so   Drug use: No   Sexual activity: Not Currently  Other Topics Concern   Not on file  Social History Narrative   [facility] (07/21/16):   Originally from [facility]. He has mostly lived in [place]. Has traveled to [place] to visit relatives. 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 No bird exposure. Previously [place] previously. Rarely ever uses his hot tub. 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 Also enjoys gardening. Does have probable asbestos exposure.    Social Drivers of Corporate investment banker Strain: Not on file  Food Insecurity: Not on file  Transportation Needs: Not on file  Physical Activity: Not on file  Stress: Not on file  Social Connections: Not on file  Intimate Partner Violence: Not on file       Objective:  BP (!) 144/80 (BP Location: Left Arm, Patient Position: Sitting)   Pulse (!) 56   Ht 5' 10 (1.778 m)   Wt 165 lb (74.8 kg)   SpO2 97%   BMI 23.68 kg/m  Wt Readings from Last 3 Encounters:  10/26/23 165 lb (74.8 kg)  09/28/23 165 lb (74.8 kg)   11/01/22 170 lb (77.1 kg)   BMI Readings from Last 3 Encounters:  10/26/23 23.68 kg/m  09/28/23 23.68 kg/m  11/01/22 24.39 kg/m   SpO2 Readings from Last 3 Encounters:  10/26/23 97%  09/28/23 97%  11/01/22 97%    Physical Exam General: NAD, alert, WD, WN Eyes: PERRL, no scleral icterus ENMT: oropharynx clear, good dentition, no oral lesions, mallampati score I Skin: warm, intact, no rashes Neck: JVD, ROM and lymph node assessment normal CV: RRR, no MRG, nl S1 and S2, no peripheral edema Resp: clear to auscultation bilaterally, mild wheezing in RLL on provocative maneuver Abdom: Normoactive bowel sounds, soft, nontender, nondistended, no hepatosplenomegaly Ext: edema Neuro: Awake alert oriented to person place time and situation  Diagnostic Review:  Last CBC Lab Results  Component Value Date   WBC 4.3 11/01/2022   HGB 14.1 11/01/2022   HCT 41.6 11/01/2022   MCV 90.0 11/01/2022   MCH 30.5 11/01/2022   RDW 12.4 11/01/2022   PLT 175 11/01/2022   Last metabolic panel Lab Results  Component Value Date   GLUCOSE 94 11/01/2022   NA 131 (L) 11/01/2022   K 4.1 11/01/2022   CL 99 11/01/2022   CO2 21 (L) 11/01/2022   BUN 14 11/01/2022   CREATININE 0.74 11/01/2022   GFRNONAA >60 11/01/2022   CALCIUM  9.0 11/01/2022   PROT 6.4 02/22/2019   ALBUMIN 4.2 02/22/2019   LABGLOB 2.2 08/24/2016   AGRATIO 1.9 08/24/2016   BILITOT 0.4 02/22/2019   ALKPHOS 64 02/22/2019   AST 41 (H) 02/22/2019   ALT 11 06/02/2020   ANIONGAP 11 11/01/2022   CT chest 2022: no consolidations, effusions, or infiltrates. Patient has LLL subpleural solitary pulmonary nodule that has been stable since 2018.    Assessment & Plan:   Assessment & Plan Mild  persistent asthma without complication Patient with adult onset asthma since the age of 25 years. He has been on ICS/LABA with Wixela for many years now. He notes episodic dyspnea on exertion especially with exposure to viral URIs from his 72 year  old grandson. He also notes typical asthma triggers including pollen and changes in climate. He did self medicate with steroids and restarted ICS/LABA in the last month which improved his symptoms. I suspect that asthma is playing a role now. I recommended starting ICS/LABA but increased the dosage as well as albuterol  MDI 2 puffs every 4 hours as needed. I also recommended doing PFTs and CXR to rule out other lung pathology. Dyspnea on exertion Assessment as above Multiple pulmonary nodules Patient has multiple small pulmonary nodules that most notable of which is in the LLL measuring < 6 mm in size and has been stable since 2018. There's no indication for further imaging. If CXR shows any new lesions, then may consider repeating CT chest at the time.  Orders Placed This Encounter  Procedures   DG Chest 2 View   Pulmonary Function Test   I spent 67 minutes reviewing patient's chart including prior consultant notes, imaging, and PFTs as well as face-to-face with the patient, over half in discussion of the diagnosis and the importance of compliance with the treatment plan.  Return in about 3 months (around 01/26/2024).   Lianette Broussard, MD

## 2023-10-26 NOTE — Patient Instructions (Addendum)
-   Start higher dose of inhaler once in the morning and once at night - Get your breathing test done - Get your CXR today - I will call you with result after I review CXR - Please follow up in 2-3 months.

## 2023-10-27 ENCOUNTER — Telehealth (HOSPITAL_BASED_OUTPATIENT_CLINIC_OR_DEPARTMENT_OTHER): Payer: Self-pay | Admitting: Pulmonary Disease

## 2023-10-27 DIAGNOSIS — R918 Other nonspecific abnormal finding of lung field: Secondary | ICD-10-CM

## 2023-10-27 NOTE — Telephone Encounter (Signed)
 Reviewed CXR performed on 10/27/23. There's a small LLL solitary pulmonary nodule not previously present on CXR in 2019. Will order CT chest WO contrast to better evaluate that.

## 2023-10-31 DIAGNOSIS — M48061 Spinal stenosis, lumbar region without neurogenic claudication: Secondary | ICD-10-CM | POA: Diagnosis not present

## 2023-11-01 ENCOUNTER — Ambulatory Visit (INDEPENDENT_AMBULATORY_CARE_PROVIDER_SITE_OTHER): Admitting: Internal Medicine

## 2023-11-01 ENCOUNTER — Telehealth: Payer: Self-pay

## 2023-11-01 DIAGNOSIS — J4530 Mild persistent asthma, uncomplicated: Secondary | ICD-10-CM

## 2023-11-01 LAB — PULMONARY FUNCTION TEST
DL/VA % pred: 98 %
DL/VA: 3.89 ml/min/mmHg/L
DLCO cor % pred: 99 %
DLCO cor: 22.98 ml/min/mmHg
DLCO unc % pred: 99 %
DLCO unc: 22.98 ml/min/mmHg
FEF 25-75 Post: 2.88 L/s
FEF 25-75 Pre: 1.97 L/s
FEF2575-%Change-Post: 46 %
FEF2575-%Pred-Post: 152 %
FEF2575-%Pred-Pre: 104 %
FEV1-%Change-Post: 9 %
FEV1-%Pred-Post: 112 %
FEV1-%Pred-Pre: 101 %
FEV1-Post: 3.02 L
FEV1-Pre: 2.75 L
FEV1FVC-%Change-Post: 3 %
FEV1FVC-%Pred-Pre: 102 %
FEV6-%Change-Post: 5 %
FEV6-%Pred-Post: 112 %
FEV6-%Pred-Pre: 105 %
FEV6-Post: 3.95 L
FEV6-Pre: 3.73 L
FEV6FVC-%Change-Post: 0 %
FEV6FVC-%Pred-Post: 106 %
FEV6FVC-%Pred-Pre: 107 %
FVC-%Change-Post: 6 %
FVC-%Pred-Post: 105 %
FVC-%Pred-Pre: 98 %
FVC-Post: 3.98 L
FVC-Pre: 3.74 L
Post FEV1/FVC ratio: 76 %
Post FEV6/FVC ratio: 99 %
Pre FEV1/FVC ratio: 73 %
Pre FEV6/FVC Ratio: 100 %
RV % pred: 131 %
RV: 3.29 L
TLC % pred: 106 %
TLC: 7.06 L

## 2023-11-01 NOTE — Telephone Encounter (Signed)
   Pre-operative Risk Assessment    Patient Name: Ronnie Hardy  DOB: 1946-02-06 MRN: [id number]   Date of last office visit: 09/28/23 THOM SLUDER, PA-C Date of next office visit: 01/29/24 OZELL FELL, MD   Request for Surgical Clearance    Procedure:  LAMINECTOMY L3/ L4  Date of Surgery:  Clearance TBD                                Surgeon:  CORDELLA RHEIN, MD Surgeon's Group or Practice Name:  [facility] Phone number:  [phone] Fax number:  [phone]   ATTN: MAEOLA DIVERS   Type of Clearance Requested:   - Medical    Type of Anesthesia:  General    Additional requests/questions:    SignedLucie DELENA Ku   11/01/2023, 7:48 AM

## 2023-11-01 NOTE — Progress Notes (Signed)
 Full PFT performed today.

## 2023-11-01 NOTE — Patient Instructions (Signed)
 Full PFT performed today.

## 2023-11-01 NOTE — Telephone Encounter (Signed)
 Asking Ronnie Hardy [facility] to weigh in on clearance, was seen 09/28/23. Appears he has an echo scheduled in 2 days.   Will defer to [facility] for final recommendations.   Thanks Angie

## 2023-11-02 ENCOUNTER — Ambulatory Visit (HOSPITAL_BASED_OUTPATIENT_CLINIC_OR_DEPARTMENT_OTHER): Payer: Self-pay | Admitting: Pulmonary Disease

## 2023-11-02 NOTE — Telephone Encounter (Signed)
 I discussed the result of CXR and PFTs with Ronnie Hardy. I informed him that PFT is in keeping with asthma. CXR shows probably nipple artifact in LLL. Radiology report not concerning. I ordered CT chest to better evaluate and patient will decide if he wants to pursue or not.

## 2023-11-03 ENCOUNTER — Ambulatory Visit (HOSPITAL_COMMUNITY)
Admission: RE | Admit: 2023-11-03 | Discharge: 2023-11-03 | Disposition: A | Discharge status: Home / Self Care | Source: Ambulatory Visit | Attending: Cardiology | Admitting: Cardiology

## 2023-11-03 ENCOUNTER — Telehealth: Payer: Self-pay | Admitting: Pulmonary Disease

## 2023-11-03 DIAGNOSIS — R0602 Shortness of breath: Secondary | ICD-10-CM | POA: Insufficient documentation

## 2023-11-03 NOTE — Telephone Encounter (Signed)
 Fax received from Dr. Cordella Rhein with [facility] to perform a Laminectomy L3/L4 under general anesthesia  on patient.  Patient needs surgery clearance. Surgery is pending. Patient was seen on 10/26/23. Office protocol is a risk assessment can be sent to surgeon if patient has been seen in 60 days or less.   Sending to Dr Alghanim for risk assessment or recommendations if patient needs to be seen in office prior to surgical procedure.

## 2023-11-04 LAB — ECHOCARDIOGRAM COMPLETE
AR max vel: 2.19 cm2
AV Area VTI: 2.39 cm2
AV Area mean vel: 2.37 cm2
AV Mean grad: 10 mmHg
AV Peak grad: 20.1 mmHg
Ao pk vel: 2.24 m/s
Area-P 1/2: 2.74 cm2
S' Lateral: 3.1 cm

## 2023-11-06 NOTE — Telephone Encounter (Signed)
 Patient can proceed with surgery. Low perioperative risk from a pulmonary standpoint and already medically optimized for asthma. ARISCAT score 19. Low risk 1.6% risk of in-hospital post-op pulmonary complications (composite including respiratory failure, respiratory infection, pleural effusion, atelectasis, pneumothorax, bronchospasm, aspiration pneumonitis). Charlanne postoperative respiratory failure score is 0.4% - Risk of mechanical ventilation for >48 hrs after surgery, or unplanned intubation <=30 days of surgery.

## 2023-11-07 NOTE — Telephone Encounter (Signed)
 Copy of this encounter was faxed to [facility]

## 2023-11-07 NOTE — Telephone Encounter (Signed)
     Primary Cardiologist: Aleene Passe, MD (Inactive)  Chart reviewed as part of pre-operative protocol coverage. Given past medical history and time since last visit, based on ACC/AHA guidelines, Gailen Venne would be at acceptable risk for the planned procedure without further cardiovascular testing.   RCRI indicates 1.1% risk of MACE.  He is able to complete greater than 4 METS of physical activity.  Patient was advised that if he develops new symptoms prior to surgery to contact our office to arrange a follow-up appointment.  He verbalized understanding.  I will route this recommendation to the requesting party via Epic fax function and remove from pre-op pool.  Please call with questions.  Josefa HERO. Jailani Hogans NP-C     11/07/2023, 8:33 AM [facility] [address] [place], [place] [address] Office [phone]

## 2023-11-16 ENCOUNTER — Ambulatory Visit: Admitting: Pulmonary Disease

## 2023-11-23 ENCOUNTER — Ambulatory Visit (HOSPITAL_BASED_OUTPATIENT_CLINIC_OR_DEPARTMENT_OTHER)
Admission: RE | Admit: 2023-11-23 | Discharge: 2023-11-23 | Disposition: A | Discharge status: Home / Self Care | Source: Ambulatory Visit | Attending: Pulmonary Disease | Admitting: Pulmonary Disease

## 2023-11-23 DIAGNOSIS — R918 Other nonspecific abnormal finding of lung field: Secondary | ICD-10-CM | POA: Insufficient documentation

## 2023-11-23 DIAGNOSIS — I700 Atherosclerosis of aorta: Secondary | ICD-10-CM | POA: Diagnosis not present

## 2023-11-30 ENCOUNTER — Ambulatory Visit: Payer: Self-pay | Admitting: Pulmonary Disease

## 2023-12-06 ENCOUNTER — Ambulatory Visit (HOSPITAL_BASED_OUTPATIENT_CLINIC_OR_DEPARTMENT_OTHER)

## 2023-12-27 DIAGNOSIS — M48062 Spinal stenosis, lumbar region with neurogenic claudication: Secondary | ICD-10-CM | POA: Diagnosis not present

## 2024-01-11 DIAGNOSIS — G8918 Other acute postprocedural pain: Secondary | ICD-10-CM | POA: Diagnosis not present

## 2024-01-25 NOTE — Progress Notes (Signed)
 Established Patient Pulmonology Office Visit   Subjective:  Patient ID: Ronnie Hardy, male    DOB: Jun 15, 1945  MRN: [id number]  CC:  Chief Complaint  Patient presents with   Follow-up    HPI  Ronnie Hardy is a 78 y.o. male with nonobstructive CAD by heart cath 06/2020, hypertension, LBBB,  s/p prostate artery embolization, & asthma.   Last seen 10/26/2023, recommended ICS/LABA and chest imaging for follow up of pulmonary nodules.  Discussed the use of AI scribe software for clinical note transcription with the patient, who gave verbal consent to proceed. History of Present Illness Ronnie Hardy is a 78 year old male with asthma who presents with shortness of breath and recent cold symptoms.  He experiences intermittent shortness of breath, often exacerbated by colds. Currently, he is experiencing a cold, which typically progresses to his chest, causing acute symptoms. He uses a Wixela inhaler twice daily, which helps prevent severe breathing difficulties, although he sometimes discontinues it when not experiencing flare-ups.  He recently completed a six-day prednisone  taper due to an acute exacerbation of symptoms, which was effective in alleviating his symptoms. He has used prednisone  once this year and recalls using it last December as well.  He has a rescue inhaler, albuterol , but uses it infrequently, primarily when wheezing. Wheezing occurs occasionally, particularly when using Wixela, but not consistently. He also has a nebulizer, which he did not use during this recent episode as the prednisone  was effective.  He experiences shortness of breath episodically, particularly when climbing stairs, but not consistently during exercise at the gym. No swelling in his legs or shortness of breath when lying flat. The patient reports that a cardiac evaluation, including a heart monitor, was performed and he was told there were no cardiac issues.  His asthma symptoms  are sometimes triggered by pollen in the spring and colds in the fall. He recalls a past emergency room visit due to asthma and reflux while training for a cycling event. He has a history of asthma and reflux, confirmed during that visit.  He recently had a physical with complete labs in June or July, which showed no anemia, with a hemoglobin level of 14. He has received multiple pneumonia vaccinations in the past.  ACT 19  ROS    Current Outpatient Medications:    B Complex Vitamins (VITAMIN-B COMPLEX) TABS, Take by mouth as directed. , Disp: , Rfl:    budesonide -formoterol  (SYMBICORT ) 160-4.5 MCG/ACT inhaler, Inhale 2 puffs into the lungs as needed., Disp: , Rfl:    Eszopiclone 3 MG TABS, Take 3 mg by mouth at bedtime. Take immediately before bedtime, Disp: , Rfl:    famotidine  (PEPCID ) 10 MG tablet, Take 10 mg by mouth 2 (two) times daily. For heartburn, Disp: , Rfl:    fluticasone -salmeterol (ADVAIR) 500-50 MCG/ACT AEPB, Inhale 1 puff into the lungs in the morning and at bedtime., Disp: 1 each, Rfl: 6   MAGNESIUM PO, Take 1 tablet by mouth daily., Disp: , Rfl:    methylPREDNISolone  (MEDROL  DOSEPAK) 4 MG TBPK tablet, Please dispense Medrol  tapered dose x 6 days, Disp: 21 tablet, Rfl: 0   Multiple Vitamin (MULTI-VITAMINS) TABS, Take 1 tablet by mouth daily., Disp: , Rfl:    olmesartan (BENICAR) 5 MG tablet, Take 5 mg by mouth daily., Disp: , Rfl:    Omega-3 1000 MG CAPS, Take 1 capsule by mouth daily. , Disp: , Rfl:    predniSONE  (STERAPRED UNI-PAK 21 TAB) 10 MG (  21) TBPK tablet, Use 1 taper as needed with asthma flare ups., Disp: 21 tablet, Rfl: 1   sildenafil (REVATIO) 20 MG tablet, Take 1 tablet by mouth as needed., Disp: , Rfl:    tamsulosin (FLOMAX) 0.4 MG CAPS capsule, Take 1 capsule by mouth as needed., Disp: , Rfl:    albuterol  (PROVENTIL ) (2.5 MG/3ML) 0.083% nebulizer solution, Take 3 mLs (2.5 mg total) by nebulization every 6 (six) hours as needed for wheezing or shortness of  breath., Disp: 75 mL, Rfl: 2      Objective:  BP (!) 143/79   Pulse 62   Ht 5' 10 (1.778 m)   Wt 168 lb (76.2 kg)   SpO2 97%   BMI 24.11 kg/m  Wt Readings from Last 3 Encounters:  01/26/24 168 lb (76.2 kg)  10/26/23 165 lb (74.8 kg)  09/28/23 165 lb (74.8 kg)   BMI Readings from Last 3 Encounters:  01/26/24 24.11 kg/m  10/26/23 23.68 kg/m  09/28/23 23.68 kg/m   SpO2 Readings from Last 3 Encounters:  01/26/24 97%  10/26/23 97%  09/28/23 97%    Physical Exam General: NAD, alert, WD, WN Eyes: PERRL, no scleral icterus ENMT: oropharynx clear, good dentition, no oral lesions, mallampati score I Skin: warm, intact, no rashes Neck: JVD flat, ROM and lymph node assessment normal CV: RRR, no MRG, nl S1 and S2, no peripheral edema Resp: inspiratory wheezing left lung on anterior auscultation, otherwise clear. Neuro: Awake alert oriented to person place time and situation   Diagnostic Review:  Last CBC Lab Results  Component Value Date   WBC 4.3 11/01/2022   HGB 14.1 11/01/2022   HCT 41.6 11/01/2022   MCV 90.0 11/01/2022   MCH 30.5 11/01/2022   RDW 12.4 11/01/2022   PLT 175 11/01/2022   Last metabolic panel Lab Results  Component Value Date   GLUCOSE 94 11/01/2022   NA 131 (L) 11/01/2022   K 4.1 11/01/2022   CL 99 11/01/2022   CO2 21 (L) 11/01/2022   BUN 14 11/01/2022   CREATININE 0.74 11/01/2022   GFRNONAA >60 11/01/2022   CALCIUM  9.0 11/01/2022   PROT 6.4 02/22/2019   ALBUMIN 4.2 02/22/2019   LABGLOB 2.2 08/24/2016   AGRATIO 1.9 08/24/2016   BILITOT 0.4 02/22/2019   ALKPHOS 64 02/22/2019   AST 41 (H) 02/22/2019   ALT 11 06/02/2020   ANIONGAP 11 11/01/2022    CT Chest 11/23/2023: IMPRESSION: 1. Stable scattered 2 mm pulmonary nodules within the left upper lobe, safely considered benign. 2. Subpleural pulmonary nodules within the right middle lobe and left lower lobe demonstrate benign patterns of calcification, compatible with  benign granuloma. 3. No suspicious or indeterminate pulmonary nodules identified. 4. Extensive coronary artery calcifications.  PFT 10/26/23: mild obstructive lung disease    Assessment & Plan:   Assessment & Plan Mild persistent asthma without complication Mild persistent asthma, uncomplicated Asthma is mild persistent with episodic shortness of breath, primarily triggered by chest colds and pollen. Recent exacerbation managed with prednisone  taper. Asthma control test indicates somewhat well-controlled asthma - score 19. Eosinophil count slightly elevated (AEC 300 cells/uL on 07/21/2016), potentially qualifying for asthma injection therapy if needed. - Continue Wixela inhaler as prescribed. - Use albuterol  nebulizer if wheezing or coughing occurs. - Prescribed prednisone  taper for SOS use at pharmacy. - Monitor asthma control and report if prednisone  is used more than 2-3 times a year. - Consider asthma injection therapy if prednisone  use increases. Multiple pulmonary nodules Multiple stable pulmonary  nodules all < 6 mm in size. Pulmonary nodules are stable and unchanged on recent CT chest. No follow up required.  Return in about 6 months (around 07/25/2024).   I spent 34 minutes reviewing patient's chart including prior consultant notes, imaging, and PFTs as well as face-to-face with the patient, over half in discussion of the diagnosis and the importance of compliance with the treatment plan.  Adabella Stanis, MD

## 2024-01-26 ENCOUNTER — Ambulatory Visit (INDEPENDENT_AMBULATORY_CARE_PROVIDER_SITE_OTHER): Admitting: Pulmonary Disease

## 2024-01-26 ENCOUNTER — Encounter (HOSPITAL_BASED_OUTPATIENT_CLINIC_OR_DEPARTMENT_OTHER): Payer: Self-pay | Admitting: Pulmonary Disease

## 2024-01-26 VITALS — BP 143/79 | HR 62 | Ht 70.0 in | Wt 168.0 lb

## 2024-01-26 DIAGNOSIS — J4530 Mild persistent asthma, uncomplicated: Secondary | ICD-10-CM | POA: Diagnosis not present

## 2024-01-26 DIAGNOSIS — R918 Other nonspecific abnormal finding of lung field: Secondary | ICD-10-CM

## 2024-01-26 MED ORDER — ALBUTEROL SULFATE (2.5 MG/3ML) 0.083% IN NEBU
2.5000 mg | INHALATION_SOLUTION | Freq: Four times a day (QID) | RESPIRATORY_TRACT | 2 refills | Status: AC | PRN
Start: 2024-01-26 — End: ?

## 2024-01-26 MED ORDER — PREDNISONE 10 MG (21) PO TBPK
ORAL_TABLET | ORAL | 1 refills | Status: AC
Start: 2024-01-26 — End: ?

## 2024-01-26 NOTE — Assessment & Plan Note (Signed)
 Mild persistent asthma, uncomplicated Asthma is mild persistent with episodic shortness of breath, primarily triggered by chest colds and pollen. Recent exacerbation managed with prednisone  taper. Asthma control test indicates somewhat well-controlled asthma - score 19. Eosinophil count slightly elevated (AEC 300 cells/uL on 07/21/2016), potentially qualifying for asthma injection therapy if needed. - Continue Wixela inhaler as prescribed. - Use albuterol  nebulizer if wheezing or coughing occurs. - Prescribed prednisone  taper for SOS use at pharmacy. - Monitor asthma control and report if prednisone  is used more than 2-3 times a year. - Consider asthma injection therapy if prednisone  use increases.

## 2024-01-26 NOTE — Patient Instructions (Signed)
  VISIT SUMMARY: Today, we discussed your asthma management, particularly in light of your recent cold and shortness of breath. We reviewed your current medications and made a plan to help manage your symptoms more effectively.  YOUR PLAN: MILD PERSISTENT ASTHMA: Your asthma is mild but persistent, with symptoms often triggered by colds and pollen. You recently had an exacerbation that was managed with a prednisone  taper. -Continue using your Wixela inhaler as prescribed, twice daily. -Use your albuterol  nebulizer if you experience wheezing or coughing. -A prescription for a prednisone  taper has been sent to your pharmacy for use in emergencies. -Monitor your asthma control and let us  know if you need to use prednisone  more than 2-3 times a year. -Consider asthma injection therapy if your need for prednisone  increases.  MULTIPLE STABLE PULMONARY NODULES: Your pulmonary nodules are stable and have not changed on your recent CT scan. -No changes needed at this time; continue with your current management.   Contains text generated by [employer].

## 2024-01-29 ENCOUNTER — Encounter: Payer: Self-pay | Admitting: Cardiovascular Disease

## 2024-01-29 ENCOUNTER — Ambulatory Visit: Attending: Cardiovascular Disease | Admitting: Cardiovascular Disease

## 2024-01-29 VITALS — BP 130/90 | HR 70 | Ht 70.5 in | Wt 167.2 lb

## 2024-01-29 DIAGNOSIS — I2510 Atherosclerotic heart disease of native coronary artery without angina pectoris: Secondary | ICD-10-CM | POA: Diagnosis not present

## 2024-01-29 DIAGNOSIS — I350 Nonrheumatic aortic (valve) stenosis: Secondary | ICD-10-CM

## 2024-01-29 DIAGNOSIS — I447 Left bundle-branch block, unspecified: Secondary | ICD-10-CM

## 2024-01-29 DIAGNOSIS — E782 Mixed hyperlipidemia: Secondary | ICD-10-CM

## 2024-01-29 DIAGNOSIS — I10 Essential (primary) hypertension: Secondary | ICD-10-CM

## 2024-01-29 MED ORDER — ROSUVASTATIN CALCIUM 10 MG PO TABS
10.0000 mg | ORAL_TABLET | Freq: Every day | ORAL | 3 refills | Status: AC
Start: 2024-01-29 — End: ?

## 2024-01-29 NOTE — Assessment & Plan Note (Signed)
 Chronic, stable

## 2024-01-29 NOTE — Assessment & Plan Note (Signed)
 No anginal symptoms.  Reviewed his cardiac catheterization and previous CTA studies.  Discussed medical management and use of a statin drug for prevention of plaque progression.

## 2024-01-29 NOTE — Patient Instructions (Addendum)
 Medication Instructions:  START Rosuvastatin  (Crestor ) 10 mg once daily   *If you need a refill on your cardiac medications before your next appointment, please call your pharmacy*  Lab Work: To be completed in 3 months: lipid panel and LFT (approximately 04/30/24)  If you have labs (blood work) drawn today and your tests are completely normal, you will receive your results only by: MyChart Message (if you have MyChart) OR A paper copy in the mail If you have any lab test that is abnormal or we need to change your treatment, we will call you to review the results.  Testing/Procedures: None ordered today.  Follow-Up: At [facility], you and your health needs are our priority.  As part of our continuing mission to provide you with exceptional heart care, our providers are all part of one team.  This team includes your primary Cardiologist (physician) and Advanced Practice Providers or APPs (Physician Assistants and Nurse Practitioners) who all work together to provide you with the care you need, when you need it.  Your next appointment:   1 year(s)  Provider:   Ozell Fell, MD

## 2024-01-29 NOTE — Assessment & Plan Note (Signed)
 Blood pressure has been controlled based on my review of his other readings.  The patient is sick today with a respiratory infection.  Diastolic blood pressure is elevated at 90 mmHg.  Continue current treatment with telmisartan.

## 2024-01-29 NOTE — Progress Notes (Signed)
 Cardiology Office Note:    Date:  01/29/2024   ID:  Yamin, Swingler 1945/07/28, MRN [id number]  PCP:  Clarice Nottingham, MD   [facility] Providers Cardiologist:  Aleene Passe, MD (Inactive)     Referring MD: Clarice Nottingham, MD   Chief Complaint  Patient presents with   Coronary Artery Disease    History of Present Illness:    Ronnie Hardy is a 78 y.o. male with a hx of nonobstructive CAD, presenting for follow-up evaluation.  The patient has been followed by Dr. Passe and I will be assuming his cardiac care moving forward.  He is known to have left bundle branch block.  Cardiac catheterization in 2022 showed mild nonobstructive coronary artery disease.  Echocardiogram August 2025 showed LVEF 50 to 55% with septal motion consistent with conduction delay and mild inferior hypokinesis.  There is very mild aortic stenosis with a mean gradient of 10 mmHg.  The patient is here alone today.  He is fighting a cold right now so he is not feeling very well.  He has a longstanding history of asthma.  He has not had any chest pain or pressure.  He denies leg swelling, heart palpitations, orthopnea, or PND.  He has no cardiac related concerns today.   Current Medications: Current Meds  Medication Sig   albuterol  (PROVENTIL ) (2.5 MG/3ML) 0.083% nebulizer solution Take 3 mLs (2.5 mg total) by nebulization every 6 (six) hours as needed for wheezing or shortness of breath.   B Complex Vitamins (VITAMIN-B COMPLEX) TABS Take by mouth as directed.    budesonide -formoterol  (SYMBICORT ) 160-4.5 MCG/ACT inhaler Inhale 2 puffs into the lungs as needed.   Eszopiclone 3 MG TABS Take 3 mg by mouth at bedtime. Take immediately before bedtime   famotidine  (PEPCID ) 10 MG tablet Take 10 mg by mouth 2 (two) times daily. For heartburn   MAGNESIUM PO Take 1 tablet by mouth daily.   Multiple Vitamin (MULTI-VITAMINS) TABS Take 1 tablet by mouth daily.   olmesartan (BENICAR) 5 MG tablet Take 5  mg by mouth daily.   Omega-3 1000 MG CAPS Take 1 capsule by mouth daily.    predniSONE  (STERAPRED UNI-PAK 21 TAB) 10 MG (21) TBPK tablet Use 1 taper as needed with asthma flare ups. (Patient taking differently: as needed. Use 1 taper as needed with asthma flare ups.)   tamsulosin (FLOMAX) 0.4 MG CAPS capsule Take 1 capsule by mouth as needed.     Allergies:   Meperidine and Anesthetics, amide   ROS:   Please see the history of present illness.    All other systems reviewed and are negative.  EKGs/Labs/Other Studies Reviewed:    The following studies were reviewed today: Cardiac Studies & Procedures   ______________________________________________________________________________________________ CARDIAC CATHETERIZATION  CARDIAC CATHETERIZATION 06/05/2020  Conclusion  Prox RCA lesion is 20% stenosed.  Dist RCA lesion is 20% stenosed.  Mid Cx lesion is 20% stenosed.  Prox LAD lesion is 20% stenosed.  Mid LAD lesion is 30% stenosed.  The left ventricular systolic function is normal.  LV end diastolic pressure is normal.  The left ventricular ejection fraction is 50-55% by visual estimate.  There is no mitral valve regurgitation.  1. Mild non-obstructive CAD 2. The LV gram suggests normal LV systolic function but all walls cannot be seen well on this study. Echo pending in several weeks.  Recommendations: Medical management of mild non-obstructive CAD  Findings Coronary Findings Diagnostic  Dominance: Right  Left Anterior Descending Vessel  is large. Prox LAD lesion is 20% stenosed. Mid LAD lesion is 30% stenosed.  Left Circumflex Vessel is large. Mid Cx lesion is 20% stenosed.  Right Coronary Artery Vessel is large. Prox RCA lesion is 20% stenosed. Dist RCA lesion is 20% stenosed.  Intervention  No interventions have been documented.   STRESS TESTS  MYOCARDIAL PERFUSION IMAGING 07/14/2016  Interpretation Summary  Nuclear stress EF: 58%. No wall motion  abnormalities  There was no ST segment deviation noted during stress. Occasional PACs/PVCs noted  This is a low risk study. No ischemia identified  Oneil Parchment, MD   ECHOCARDIOGRAM  ECHOCARDIOGRAM COMPLETE 11/03/2023  Narrative ECHOCARDIOGRAM REPORT    Patient Name:   Ronnie Hardy [facility] Date of Exam: 11/03/2023 Medical Rec #:  [id number]           Height:       70.0 in Accession #:    [id number]          Weight:       165.0 lb Date of Birth:  16-Feb-1946           BSA:          1.923 m Patient Age:    78 years            BP:           138/76 mmHg Patient Gender: M                   HR:           52 bpm. Exam Location:  [address]  Procedure: 2D Echo, Cardiac Doppler and Color Doppler (Both Spectral and Color Flow Doppler were utilized during procedure).  Indications:    R06.02 SOB  History:        Patient has prior history of Echocardiogram examinations, most recent 09/27/2022. CAD, Arrythmias:LBBB, Signs/Symptoms:Shortness of Breath; Risk Factors:Hypertension and Dyslipidemia.  Sonographer:    Elsie Bohr RDCS Referring Phys: [id number] SHENG L HALEY   Sonographer Comments: Technically difficult study due to poor echo windows. Image acquisition challenging due to respiratory motion. IMPRESSIONS   1. Septal motion consistent with conduction delay. Mild inferior hypokinesis.. Left ventricular ejection fraction, by estimation, is 50 to 55%. Left ventricular ejection fraction by 3D volume is 50 %. The left ventricle has low normal function. 2. Right ventricular systolic function is mildly reduced. The right ventricular size is normal. 3. Mild mitral valve regurgitation. 4. AV is thickened, calcified with mildly restricted motion. Peak and mean gradients through the valve are 20 and 10 mm HG respectively Overall very mild AS. SABRA The aortic valve is tricuspid. Aortic valve regurgitation is not visualized. 5. The inferior vena cava is normal in size with greater than 50%  respiratory variability, suggesting right atrial pressure of 3 mmHg.  FINDINGS Left Ventricle: Septal motion consistent with conduction delay. Mild inferior hypokinesis. Left ventricular ejection fraction, by estimation, is 50 to 55%. Left ventricular ejection fraction by 3D volume is 50 %. The left ventricle has low normal function. Global longitudinal strain performed but not reported based on interpreter judgement due to suboptimal tracking. The left ventricular internal cavity size was normal in size. There is no left ventricular hypertrophy.  Right Ventricle: The right ventricular size is normal. Right vetricular wall thickness was not assessed. Right ventricular systolic function is mildly reduced.  Left Atrium: Left atrial size was normal in size.  Right Atrium: Right atrial size was normal in size.  Pericardium: There is no evidence  of pericardial effusion.  Mitral Valve: There is mild thickening of the mitral valve leaflet(s). Mild mitral valve regurgitation.  Tricuspid Valve: The tricuspid valve is normal in structure. Tricuspid valve regurgitation is mild.  Aortic Valve: AV is thickened, calcified with mildly restricted motion. Peak and mean gradients through the valve are 20 and 10 mm HG respectively Overall very mild AS. The aortic valve is tricuspid. Aortic valve regurgitation is not visualized. Aortic valve mean gradient measures 10.0 mmHg. Aortic valve peak gradient measures 20.1 mmHg. Aortic valve area, by VTI measures 2.39 cm.  Pulmonic Valve: The pulmonic valve was normal in structure. Pulmonic valve regurgitation is trivial.  Aorta: The aortic root and ascending aorta are structurally normal, with no evidence of dilitation.  Venous: The inferior vena cava is normal in size with greater than 50% respiratory variability, suggesting right atrial pressure of 3 mmHg.  IAS/Shunts: No atrial level shunt detected by color flow Doppler.   LEFT VENTRICLE PLAX 2D LVIDd:          4.60 cm         Diastology LVIDs:         3.10 cm         LV e' medial:    7.83 cm/s LV PW:         0.90 cm         LV E/e' medial:  9.4 LV IVS:        1.00 cm         LV e' lateral:   8.49 cm/s LVOT diam:     2.20 cm         LV E/e' lateral: 8.6 LV SV:         111 LV SV Index:   58 LVOT Area:     3.80 cm        3D Volume EF LV 3D EF:    Left ventricul ar ejection fraction by 3D volume is 50 %.  3D Volume EF: 3D EF:        50 % LV EDV:       188 ml LV ESV:       94 ml LV SV:        94 ml  RIGHT VENTRICLE             IVC RV S prime:     15.00 cm/s  IVC diam: 1.00 cm TAPSE (M-mode): 2.2 cm RVSP:           22.5 mmHg  LEFT ATRIUM             Index        RIGHT ATRIUM           Index LA diam:        3.60 cm 1.87 cm/m   RA Pressure: 3.00 mmHg LA Vol (A2C):   51.6 ml 26.83 ml/m  RA Area:     19.50 cm LA Vol (A4C):   38.8 ml 20.17 ml/m  RA Volume:   54.30 ml  28.23 ml/m LA Biplane Vol: 45.5 ml 23.66 ml/m AORTIC VALVE AV Area (Vmax):    2.19 cm AV Area (Vmean):   2.37 cm AV Area (VTI):     2.39 cm AV Vmax:           224.00 cm/s AV Vmean:          150.000 cm/s AV VTI:            0.465 m AV Peak  Grad:      20.1 mmHg AV Mean Grad:      10.0 mmHg LVOT Vmax:         129.00 cm/s LVOT Vmean:        93.700 cm/s LVOT VTI:          0.292 m LVOT/AV VTI ratio: 0.63  AORTA Ao Root diam: 3.30 cm Ao Asc diam:  3.30 cm  MITRAL VALVE               TRICUSPID VALVE MV Area (PHT): 2.74 cm    TR Peak grad:   19.5 mmHg MV Decel Time: 277 msec    TR Vmax:        221.00 cm/s MV E velocity: 73.40 cm/s  Estimated RAP:  3.00 mmHg MV A velocity: 69.70 cm/s  RVSP:           22.5 mmHg MV E/A ratio:  1.05 SHUNTS Systemic VTI:  0.29 m Systemic Diam: 2.20 cm  Vina Gull MD Electronically signed by Vina Gull MD Signature Date/Time: 11/04/2023/4:25:28 PM    Final      CT SCANS  CT CORONARY FRACTIONAL FLOW RESERVE DATA PREP 02/14/2019  Narrative EXAM: FFRCT  ANALYSIS  FINDINGS: FFRct analysis was performed on the original cardiac CT angiogram dataset. Diagrammatic representation of the FFRct analysis is provided in a separate PDF document in PACS. This dictation was created using the PDF document and an interactive 3D model of the results. 3D model is not available in the EMR/PACS. Normal FFR range is >0.80.  1. Left Main: No significant stenosis. FFR = 0.98.  2. LAD: No significant stenosis. Proximal FFR = 0.95, Mid FFR = 0.95, Distal FFR = 0.88.  3. LCX: No significant stenosis. Proximal FFR = 0.98, Mid FFR = 0.97, Distal FFR = 0.93.  4. RCA: No significant stenosis. Proximal FFR = 0.99, Mid FFR = 0.97, Distal FFR = 0.91, PDA FFR = 0.94.  IMPRESSION: 1. CT FFR analysis demonstrated no significant flow limiting lesions.  Wilbert Turner   Electronically Signed By: Wilbert Bihari On: 02/15/2019 10:11   CT SCANS  CT CORONARY MORPH W/CTA COR W/SCORE 02/13/2019  Addendum 02/14/2019  5:58 PM ADDENDUM REPORT: 02/14/2019 17:56  EXAM: Cardiac/Coronary  CT  TECHNIQUE: The patient was scanned on a [employer].  FINDINGS: A 120 kV prospective scan was triggered in the descending thoracic aorta at 111 HU's. Axial non-contrast 3 mm slices were carried out through the heart. The data set was analyzed on a dedicated work station and scored using the Agatson method. Gantry rotation speed was 250 msecs and collimation was .6 mm. No beta blockade and 0.8 mg of sl NTG was given. The 3D data set was reconstructed in 5% intervals of the 67-82 % of the R-R cycle. Diastolic phases were analyzed on a dedicated work station using MPR, MIP and VRT modes. The patient received 80 cc of contrast.  Aorta:  Normal size.  Mild calcifications.  No dissection.  Aortic Valve:  Trileaflet.  No calcifications.  Coronary Arteries:  Normal coronary origin.  Right dominance.  RCA is a large dominant artery that gives rise to PDA and  PLVB. There is mild calcified plaque in the distal RCA with associated stenosis of 25-49%.  Left main is a large artery that gives rise to LAD and LCX arteries.  LAD is a large vessel that gives rise to a very small D1, moderate sized branching D2 and moderate sized branching D3. There  is mild calcified plaque in the proximal LAD with associated stenosis of 25-49%. The is moderate calcified plaque in the mid LAD which involves with ostium of the 2nd and 3rd diagonal branches with associated stenosis of 50-69% which may be over estimated due to blooming artifact.  LCX is a non-dominant artery that gives rise to one moderated sized OM1 branch and large branching OM2. There is no plaque.  Other findings:  Normal pulmonary vein drainage into the left atrium.  Normal let atrial appendage without a thrombus.  Normal size of the pulmonary artery.  IMPRESSION: 1. Coronary calcium  score of 507. This was 68th percentile for age and sex matched control.  2. Normal coronary origin with right dominance.  3. There is mild to moderate atherosclerotic plaque in the LAD. The degree of stenosis may be over estimated due to blooming artifact. CAD-RADS 2  4. This study has been submitted for FFR analysis.  5. Consider preventative therapy and risk factor modification.  Wilbert Bihari   Electronically Signed By: Wilbert Bihari On: 02/14/2019 17:56  Narrative EXAM: OVER-READ INTERPRETATION CT CHEST  The following report is an over-read performed by radiologist Dr. Debby Satterfield of [facility] on 02/13/2019. This over-read does not include interpretation of cardiac or coronary anatomy or pathology. The coronary calcium  score/coronary CTA interpretation by the cardiologist is attached.  COMPARISON:  09/06/2018 and earlier.  FINDINGS: Vascular: No visible atherosclerosis and no evidence of aneurysm involving the visualized aorta.  Mediastinum/Nodes: No pathologic  lymphadenopathy within the visualized mediastinum. Visualized esophagus normal in appearance.  Lungs/Pleura: 6 mm subpleural/pleural based nodule anteriorly in the RIGHT MIDDLE LOBE (series 12/image 26), unchanged dating back to 2018, indicating benignity. No new lung parenchymal abnormalities. No pleural effusions. Central airways patent without significant bronchial wall thickening.  Upper Abdomen: Unremarkable for the early arterial phase of enhancement which accounts for the heterogeneous enhancement of the spleen.  Musculoskeletal: Degenerative disc disease and spondylosis involving the visualized thoracic spine.  IMPRESSION: 1. 6 mm subpleural/pleural based nodule anteriorly in the RIGHT MIDDLE LOBE, unchanged dating back to 2018, indicating benignity. 2. No significant extracardiac findings.  Electronically Signed: By: Debby Satterfield M.D. On: 02/13/2019 17:19   CT SCANS  CT CARDIAC SCORING (SELF PAY ONLY) 07/01/2016  Narrative CLINICAL DATA:  78 year old white male with atherosclerosis of the aorta.  EXAM: CT HEART FOR CALCIUM  SCORING  TECHNIQUE: CT heart was performed on a 64 channel system using prospective ECG gating.  A non-contrast exam for calcium  scoring was performed.  Note that this exam targets the heart and the chest was not imaged in its entirety.  COMPARISON:  None.  FINDINGS: Technical quality: Good.  CORONARY CALCIUM   Total Agatston Score: 390  MESA database percentile:  68%  OTHER FINDINGS:  Cardiovascular: Majority of the coronary artery calcium  is in the LAD. Few calcifications at the root of the aorta. Normal caliber of the visualized thoracic aorta. Normal caliber of the main pulmonary arteries.  Mediastinum/Nodes: No lymphadenopathy in the visualized chest. No significant pericardial fluid.  Lungs/Pleura: No pleural effusions. Pleural-based 4 mm nodule in the right middle lobe on sequence 4, image 17. 5 mm nodule in the  right middle lobe on sequence 4, image 22. Pleural-based nodule in the left lower lobe measures 5 mm on sequence 4, image 24. There are additional punctate pleural-based nodules. No large areas of consolidation. Few peripheral densities in the right middle lobe and right lower lobe could represent atelectasis or mild scarring.  Upper  Abdomen: Unremarkable.  Musculoskeletal: No suspicious abnormality.  IMPRESSION: Coronary calcium  score is 390 which is 68% for subjects of the same age, gender and ethnicity.  Multiple small peripheral lung nodules. These small pulmonary nodules are indeterminate. Largest nodule measures 5 mm. No follow-up needed if patient is low-risk (and has no known or suspected primary neoplasm). Non-contrast chest CT can be considered in 12 months if patient is high-risk. This recommendation follows the consensus statement: Guidelines for Management of Incidental Pulmonary Nodules Detected on CT Images: From the Fleischner Society 2017; Radiology 2017; 284:228-243.   Electronically Signed By: Juliene Balder M.D. On: 07/01/2016 15:07     ______________________________________________________________________________________________      EKG:        Recent Labs: 09/28/2023: TSH 1.510  Recent Lipid Panel    Component Value Date/Time   CHOL 159 06/02/2020 1257   TRIG 63 06/02/2020 1257   HDL 102 06/02/2020 1257   CHOLHDL 1.6 06/02/2020 1257   LDLCALC 44 06/02/2020 1257           Physical Exam:    VS:  BP (!) 130/90 (BP Location: Left Arm, Patient Position: Sitting, Cuff Size: Normal)   Pulse 70   Ht 5' 10.5 (1.791 m)   Wt 167 lb 3.2 oz (75.8 kg)   SpO2 95%   BMI 23.65 kg/m     Wt Readings from Last 3 Encounters:  01/29/24 167 lb 3.2 oz (75.8 kg)  01/26/24 168 lb (76.2 kg)  10/26/23 165 lb (74.8 kg)     GEN:  Well nourished, well developed in no acute distress HEENT: Normal NECK: No JVD; No carotid bruits LYMPHATICS: No  lymphadenopathy CARDIAC: RRR, 2/6 ejection murmur at the right upper sternal border RESPIRATORY:  Clear to auscultation without rales, wheezing or rhonchi  ABDOMEN: Soft, non-tender, non-distended MUSCULOSKELETAL:  No edema; No deformity  SKIN: Warm and dry NEUROLOGIC:  Alert and oriented x 3 PSYCHIATRIC:  Normal affect   Assessment & Plan Coronary artery disease involving native coronary artery of native heart without angina pectoris No anginal symptoms.  Reviewed his cardiac catheterization and previous CTA studies.  Discussed medical management and use of a statin drug for prevention of plaque progression. LBBB (left bundle branch block) Chronic, stable Primary hypertension Blood pressure has been controlled based on my review of his other readings.  The patient is sick today with a respiratory infection.  Diastolic blood pressure is elevated at 90 mmHg.  Continue current treatment with telmisartan. Nonrheumatic aortic (valve) stenosis Very mild aortic stenosis by exam and echo.  Anticipate a surveillance echocardiogram in 2 to 3 years Mixed hyperlipidemia He has been off of statin medications due to concerns about taking it.  He did not have side effects in the past.  Discussed risk/benefit and through shared decision making conversation he has decided to go back on rosuvastatin  10 mg daily.  Will bring him back in 3 to 4 months with lipids and LFTs.            Medication Adjustments/Labs and Tests Ordered: Current medicines are reviewed at length with the patient today.  Concerns regarding medicines are outlined above.  No orders of the defined types were placed in this encounter.  No orders of the defined types were placed in this encounter.   There are no Patient Instructions on file for this visit.   Signed, Ozell Fell, MD  01/29/2024 10:28 AM    [facility]

## 2024-04-04 ENCOUNTER — Encounter (HOSPITAL_BASED_OUTPATIENT_CLINIC_OR_DEPARTMENT_OTHER): Payer: Self-pay | Admitting: Pulmonary Disease

## 2024-04-04 NOTE — Telephone Encounter (Signed)
 Will you please address in Dr Catherine absence

## 2024-04-08 ENCOUNTER — Encounter: Payer: Self-pay | Admitting: *Deleted

## 2024-04-08 NOTE — Progress Notes (Signed)
 Ronnie Hardy                                          MRN: [id number]   04/08/2024   The [facility] Quality Team Specialist reviewed this patient medical record for the purposes of chart review for care gap closure. The following were reviewed: chart review for care gap closure-controlling blood pressure.    [facility] Quality Team
